# Patient Record
Sex: Female | Born: 1945
Health system: Southern US, Community
[De-identification: ages and names within clinical notes are randomized; demographics above are authoritative.]

## PROBLEM LIST (undated history)

## (undated) DIAGNOSIS — I219 Acute myocardial infarction, unspecified: Secondary | ICD-10-CM

## (undated) DIAGNOSIS — E039 Hypothyroidism, unspecified: Secondary | ICD-10-CM

## (undated) DIAGNOSIS — M5136 Other intervertebral disc degeneration, lumbar region: Secondary | ICD-10-CM

## (undated) DIAGNOSIS — E785 Hyperlipidemia, unspecified: Secondary | ICD-10-CM

## (undated) DIAGNOSIS — H35039 Hypertensive retinopathy, unspecified eye: Secondary | ICD-10-CM

## (undated) DIAGNOSIS — H40059 Ocular hypertension, unspecified eye: Secondary | ICD-10-CM

## (undated) DIAGNOSIS — H3530 Unspecified macular degeneration: Secondary | ICD-10-CM

## (undated) DIAGNOSIS — H269 Unspecified cataract: Secondary | ICD-10-CM

## (undated) DIAGNOSIS — H409 Unspecified glaucoma: Secondary | ICD-10-CM

## (undated) DIAGNOSIS — I10 Essential (primary) hypertension: Secondary | ICD-10-CM

## (undated) DIAGNOSIS — M5431 Sciatica, right side: Secondary | ICD-10-CM

## (undated) DIAGNOSIS — M51369 Other intervertebral disc degeneration, lumbar region without mention of lumbar back pain or lower extremity pain: Secondary | ICD-10-CM

## (undated) DIAGNOSIS — S0300XA Dislocation of jaw, unspecified side, initial encounter: Secondary | ICD-10-CM

## (undated) HISTORY — DX: Sciatica, right side: M54.31

## (undated) HISTORY — DX: Unspecified cataract: H26.9

## (undated) HISTORY — DX: Other intervertebral disc degeneration, lumbar region: M51.36

## (undated) HISTORY — DX: Unspecified glaucoma: H40.9

## (undated) HISTORY — PX: ABDOMINAL SACROCOLPOPEXY: SHX1114

## (undated) HISTORY — PX: BREAST BIOPSY: SHX20

## (undated) HISTORY — PX: TONSILLECTOMY: SUR1361

## (undated) HISTORY — DX: Acute myocardial infarction, unspecified: I21.9

## (undated) HISTORY — DX: Hypertensive retinopathy, unspecified eye: H35.039

## (undated) HISTORY — PX: APOGEE / PERIGEE REPAIR: SHX1182

## (undated) HISTORY — PX: DILATION AND CURETTAGE OF UTERUS: SHX78

## (undated) HISTORY — DX: Unspecified macular degeneration: H35.30

## (undated) HISTORY — DX: Other intervertebral disc degeneration, lumbar region without mention of lumbar back pain or lower extremity pain: M51.369

## (undated) HISTORY — PX: ABDOMINAL HYSTERECTOMY: SHX81

---

## 2000-10-13 ENCOUNTER — Other Ambulatory Visit: Admission: RE | Admit: 2000-10-13 | Discharge: 2000-10-13 | Payer: Self-pay | Admitting: Obstetrics and Gynecology

## 2000-10-14 ENCOUNTER — Ambulatory Visit (HOSPITAL_COMMUNITY): Admission: RE | Admit: 2000-10-14 | Discharge: 2000-10-14 | Payer: Self-pay | Admitting: Obstetrics and Gynecology

## 2001-02-19 ENCOUNTER — Ambulatory Visit (HOSPITAL_COMMUNITY): Admission: RE | Admit: 2001-02-19 | Discharge: 2001-02-19 | Payer: Self-pay | Admitting: Obstetrics and Gynecology

## 2001-11-25 ENCOUNTER — Encounter: Admission: RE | Admit: 2001-11-25 | Discharge: 2001-11-25 | Payer: Self-pay | Admitting: General Surgery

## 2001-11-25 ENCOUNTER — Ambulatory Visit (HOSPITAL_BASED_OUTPATIENT_CLINIC_OR_DEPARTMENT_OTHER): Admission: RE | Admit: 2001-11-25 | Discharge: 2001-11-25 | Payer: Self-pay | Admitting: General Surgery

## 2001-11-25 ENCOUNTER — Encounter: Payer: Self-pay | Admitting: General Surgery

## 2010-11-07 ENCOUNTER — Other Ambulatory Visit: Payer: Self-pay | Admitting: Radiology

## 2010-11-07 DIAGNOSIS — N6489 Other specified disorders of breast: Secondary | ICD-10-CM

## 2010-11-14 ENCOUNTER — Ambulatory Visit
Admission: RE | Admit: 2010-11-14 | Discharge: 2010-11-14 | Disposition: A | Discharge status: Home / Self Care | Payer: Medicare Other | Source: Ambulatory Visit | Attending: Radiology | Admitting: Radiology

## 2010-11-14 DIAGNOSIS — N6489 Other specified disorders of breast: Secondary | ICD-10-CM

## 2010-11-14 MED ORDER — GADOBENATE DIMEGLUMINE 529 MG/ML IV SOLN
15.0000 mL | Freq: Once | INTRAVENOUS | Status: AC | PRN
  Administered 2010-11-14: 15 mL via INTRAVENOUS

## 2011-01-16 ENCOUNTER — Emergency Department (INDEPENDENT_AMBULATORY_CARE_PROVIDER_SITE_OTHER): Payer: Medicare Other

## 2011-01-16 ENCOUNTER — Other Ambulatory Visit: Payer: Self-pay

## 2011-01-16 ENCOUNTER — Inpatient Hospital Stay (HOSPITAL_BASED_OUTPATIENT_CLINIC_OR_DEPARTMENT_OTHER)
Admission: EM | Admit: 2011-01-16 | Discharge: 2011-01-18 | DRG: 281 | Disposition: A | Discharge status: Home / Self Care | Payer: Medicare Other | Attending: Cardiovascular Disease | Admitting: Cardiovascular Disease

## 2011-01-16 ENCOUNTER — Encounter: Payer: Self-pay | Admitting: *Deleted

## 2011-01-16 DIAGNOSIS — K5730 Diverticulosis of large intestine without perforation or abscess without bleeding: Secondary | ICD-10-CM

## 2011-01-16 DIAGNOSIS — E039 Hypothyroidism, unspecified: Secondary | ICD-10-CM | POA: Diagnosis present

## 2011-01-16 DIAGNOSIS — M549 Dorsalgia, unspecified: Secondary | ICD-10-CM

## 2011-01-16 DIAGNOSIS — E785 Hyperlipidemia, unspecified: Secondary | ICD-10-CM | POA: Diagnosis present

## 2011-01-16 DIAGNOSIS — I214 Non-ST elevation (NSTEMI) myocardial infarction: Principal | ICD-10-CM | POA: Diagnosis present

## 2011-01-16 DIAGNOSIS — R079 Chest pain, unspecified: Secondary | ICD-10-CM | POA: Diagnosis present

## 2011-01-16 DIAGNOSIS — I5181 Takotsubo syndrome: Secondary | ICD-10-CM | POA: Diagnosis present

## 2011-01-16 DIAGNOSIS — I10 Essential (primary) hypertension: Secondary | ICD-10-CM | POA: Diagnosis present

## 2011-01-16 DIAGNOSIS — Z7982 Long term (current) use of aspirin: Secondary | ICD-10-CM

## 2011-01-16 DIAGNOSIS — H40059 Ocular hypertension, unspecified eye: Secondary | ICD-10-CM | POA: Insufficient documentation

## 2011-01-16 HISTORY — DX: Dislocation of jaw, unspecified side, initial encounter: S03.00XA

## 2011-01-16 HISTORY — DX: Essential (primary) hypertension: I10

## 2011-01-16 HISTORY — DX: Hyperlipidemia, unspecified: E78.5

## 2011-01-16 HISTORY — DX: Hypothyroidism, unspecified: E03.9

## 2011-01-16 HISTORY — DX: Ocular hypertension, unspecified eye: H40.059

## 2011-01-16 LAB — BASIC METABOLIC PANEL
CO2: 27 mEq/L (ref 19–32)
Chloride: 102 mEq/L (ref 96–112)
GFR calc non Af Amer: >90 mL/min (ref >90)
Glucose, Bld: 127 mg/dL — ABNORMAL HIGH (ref 70–99)
Potassium: 3.4 mEq/L — ABNORMAL LOW (ref 3.5–5.1)
Sodium: 138 mEq/L (ref 135–145)

## 2011-01-16 LAB — DIFFERENTIAL
Eosinophils Absolute: 0.2 10*3/uL (ref 0.0–0.7)
Lymphs Abs: 1.3 10*3/uL (ref 0.7–4.0)
Monocytes Relative: 7 % (ref 3–12)
Neutrophils Relative %: 76 % (ref 43–77)

## 2011-01-16 LAB — CBC
HCT: 39.5 % (ref 36.0–46.0)
Hemoglobin: 13 g/dL (ref 12.0–15.0)
MCH: 24.6 pg — ABNORMAL LOW (ref 26.0–34.0)
MCV: 74.7 fL — ABNORMAL LOW (ref 78.0–100.0)
RBC: 5.29 MIL/uL — ABNORMAL HIGH (ref 3.87–5.11)

## 2011-01-16 MED ORDER — CALCIUM CITRATE-VITAMIN D 315-200 MG-UNIT PO TABS: 1.0000 | ORAL_TABLET | Freq: Every day | ORAL | Status: DC

## 2011-01-16 MED ORDER — ASPIRIN EC 81 MG PO TBEC
81.0000 mg | DELAYED_RELEASE_TABLET | Freq: Once | ORAL | Status: DC
  Filled 2011-01-16: qty 1

## 2011-01-16 MED ORDER — LOSARTAN POTASSIUM 50 MG PO TABS
100.0000 mg | ORAL_TABLET | Freq: Every day | ORAL | Status: DC
  Administered 2011-01-17 – 2011-01-18 (×2): 100 mg via ORAL
  Filled 2011-01-16 (×2): qty 2

## 2011-01-16 MED ORDER — SODIUM CHLORIDE 0.9 % IJ SOLN: 3.0000 mL | INTRAMUSCULAR | Status: DC | PRN

## 2011-01-16 MED ORDER — ROSUVASTATIN CALCIUM 20 MG PO TABS
20.0000 mg | ORAL_TABLET | Freq: Every day | ORAL | Status: DC
  Filled 2011-01-16: qty 1

## 2011-01-16 MED ORDER — SODIUM CHLORIDE 0.9 % IV BOLUS (SEPSIS): 500.0000 mL | Freq: Once | INTRAVENOUS | Status: DC

## 2011-01-16 MED ORDER — MORPHINE SULFATE 4 MG/ML IJ SOLN
4.0000 mg | INTRAMUSCULAR | Status: AC
  Administered 2011-01-16: 4 mg via INTRAVENOUS
  Filled 2011-01-16: qty 1

## 2011-01-16 MED ORDER — DIAZEPAM 5 MG PO TABS
5.0000 mg | ORAL_TABLET | ORAL | Status: AC
  Administered 2011-01-18: 5 mg via ORAL

## 2011-01-16 MED ORDER — SODIUM CHLORIDE 0.9 % IJ SOLN
3.0000 mL | Freq: Two times a day (BID) | INTRAMUSCULAR | Status: DC
  Administered 2011-01-17: 3 mL via INTRAVENOUS

## 2011-01-16 MED ORDER — HEPARIN BOLUS VIA INFUSION
4000.0000 [IU] | Freq: Once | INTRAVENOUS | Status: AC
  Administered 2011-01-16: 4000 [IU] via INTRAVENOUS
  Filled 2011-01-16: qty 4000

## 2011-01-16 MED ORDER — ASPIRIN EC 81 MG PO TBEC
81.0000 mg | DELAYED_RELEASE_TABLET | Freq: Every day | ORAL | Status: DC
  Filled 2011-01-16 (×2): qty 1

## 2011-01-16 MED ORDER — ASPIRIN 81 MG PO CHEW
324.0000 mg | CHEWABLE_TABLET | ORAL | Status: AC
  Administered 2011-01-17: 324 mg via ORAL
  Filled 2011-01-16: qty 4

## 2011-01-16 MED ORDER — LEVOTHYROXINE SODIUM 75 MCG PO TABS
75.0000 ug | ORAL_TABLET | Freq: Every day | ORAL | Status: DC
  Administered 2011-01-17 – 2011-01-18 (×2): 75 ug via ORAL
  Filled 2011-01-16 (×2): qty 1

## 2011-01-16 MED ORDER — ONDANSETRON HCL 4 MG/2ML IJ SOLN
INTRAMUSCULAR | Status: AC
  Filled 2011-01-16: qty 2

## 2011-01-16 MED ORDER — BIMATOPROST 0.03 % OP SOLN
1.0000 [drp] | Freq: Every day | OPHTHALMIC | Status: DC
  Administered 2011-01-17 (×2): 1 [drp] via OPHTHALMIC
  Filled 2011-01-16: qty 2.5

## 2011-01-16 MED ORDER — CALCIUM CARBONATE-VITAMIN D 500-200 MG-UNIT PO TABS
1.0000 | ORAL_TABLET | Freq: Two times a day (BID) | ORAL | Status: DC
  Administered 2011-01-17 – 2011-01-18 (×3): 1 via ORAL
  Filled 2011-01-16 (×5): qty 1

## 2011-01-16 MED ORDER — POTASSIUM CHLORIDE CRYS ER 10 MEQ PO TBCR
10.0000 meq | EXTENDED_RELEASE_TABLET | Freq: Three times a day (TID) | ORAL | Status: DC
  Administered 2011-01-17 – 2011-01-18 (×5): 10 meq via ORAL
  Filled 2011-01-16 (×7): qty 1

## 2011-01-16 MED ORDER — ONDANSETRON HCL 4 MG/2ML IJ SOLN
4.0000 mg | Freq: Once | INTRAMUSCULAR | Status: AC
  Administered 2011-01-16: 4 mg via INTRAVENOUS

## 2011-01-16 MED ORDER — ACETAMINOPHEN 325 MG PO TABS: 650.0000 mg | ORAL_TABLET | ORAL | Status: DC | PRN

## 2011-01-16 MED ORDER — VITAMIN D3 25 MCG (1000 UNIT) PO TABS
1000.0000 [IU] | ORAL_TABLET | Freq: Every day | ORAL | Status: DC
  Administered 2011-01-17 – 2011-01-18 (×2): 1000 [IU] via ORAL
  Filled 2011-01-16 (×3): qty 1

## 2011-01-16 MED ORDER — LOSARTAN POTASSIUM 50 MG PO TABS: 100.0000 mg | ORAL_TABLET | Freq: Every day | ORAL | Status: DC

## 2011-01-16 MED ORDER — ONDANSETRON HCL 4 MG/2ML IJ SOLN: 4.0000 mg | Freq: Four times a day (QID) | INTRAMUSCULAR | Status: DC | PRN

## 2011-01-16 MED ORDER — LIOTHYRONINE SODIUM 25 MCG PO TABS
25.0000 ug | ORAL_TABLET | Freq: Every day | ORAL | Status: DC
  Administered 2011-01-17 – 2011-01-18 (×2): 25 ug via ORAL
  Filled 2011-01-16 (×2): qty 1

## 2011-01-16 MED ORDER — NITROGLYCERIN IN D5W 200-5 MCG/ML-% IV SOLN
3.0000 ug/min | INTRAVENOUS | Status: DC
  Administered 2011-01-17: 3 ug/min via INTRAVENOUS
  Filled 2011-01-16: qty 250

## 2011-01-16 MED ORDER — TIMOLOL HEMIHYDRATE 0.25 % OP SOLN: 1.0000 [drp] | Freq: Two times a day (BID) | OPHTHALMIC | Status: DC

## 2011-01-16 MED ORDER — IOHEXOL 350 MG/ML SOLN
100.0000 mL | Freq: Once | INTRAVENOUS | Status: AC | PRN
  Administered 2011-01-16: 100 mL via INTRAVENOUS

## 2011-01-16 MED ORDER — NITROGLYCERIN 0.4 MG SL SUBL
0.4000 mg | SUBLINGUAL_TABLET | SUBLINGUAL | Status: DC | PRN
  Administered 2011-01-16: 0.4 mg via SUBLINGUAL
  Filled 2011-01-16: qty 25

## 2011-01-16 MED ORDER — SODIUM CHLORIDE 0.9 % IV SOLN: 250.0000 mL | INTRAVENOUS | Status: DC

## 2011-01-16 MED ORDER — ASPIRIN 81 MG PO CHEW
324.0000 mg | CHEWABLE_TABLET | Freq: Once | ORAL | Status: AC
  Administered 2011-01-16: 324 mg via ORAL
  Filled 2011-01-16: qty 4

## 2011-01-16 MED ORDER — ZOLPIDEM TARTRATE 5 MG PO TABS: 5.0000 mg | ORAL_TABLET | Freq: Every day | ORAL | Status: DC

## 2011-01-16 MED ORDER — HEPARIN (PORCINE) IN NACL 100-0.45 UNIT/ML-% IJ SOLN
16.0000 [IU]/kg/h | Freq: Once | INTRAMUSCULAR | Status: AC
  Administered 2011-01-16: 16 [IU]/kg/h via INTRAVENOUS
  Filled 2011-01-16: qty 250

## 2011-01-16 MED ORDER — TRIAMTERENE-HCTZ 37.5-25 MG PO TABS
1.0000 | ORAL_TABLET | Freq: Every day | ORAL | Status: DC
  Administered 2011-01-17 – 2011-01-18 (×2): 1 via ORAL
  Filled 2011-01-16 (×2): qty 1

## 2011-01-16 MED ORDER — OSTEO BI-FLEX/5-LOXIN ADVANCED PO TABS: 1.0000 | ORAL_TABLET | Freq: Every day | ORAL | Status: DC

## 2011-01-16 NOTE — ED Provider Notes (Cosign Needed Addendum)
History     CSN: [id number] Arrival date & time: 01/16/2011  6:15 PM   First MD Initiated Contact with Patient 01/16/11 1823      Chief Complaint  Patient presents with  . URI  . Shortness of Breath   The patient has had a recent cold symptoms. This afternoon at approximately 4 PM she was walking in the cold weather. She was also talking with her mother on the phone and feels that she was in somewhat of a heated conversation. Patient states that she felt that she inhaled the cold air into her lungs and began having a discomfort in her upper chest and her back when this occurred. She states the back. Tenderness is still present. She had no nausea, vomiting. No radiation of the pain to the extremities. She did have recent surgery in September but denies any leg swelling or pain. Patient had no fever, no syncope. No dyspnea. No vomiting. She reports a normal stress test done approximately 10 years ago. Patient also states that she does have chronic anxiety. (Consider location/radiation/quality/duration/timing/severity/associated sxs/prior treatment) Patient is a 65 y.o. female presenting with URI and shortness of breath.  URI  Shortness of Breath  Associated symptoms include shortness of breath.    Past Medical History  Diagnosis Date  . Hypertension   . Hyperlipemia   . Thyroid disease   . Ocular hypertension   . TMJ (dislocation of temporomandibular joint)     Past Surgical History  Procedure Date  . Abdominal hysterectomy   . Tonsillectomy     History reviewed. No pertinent family history.  History  Substance Use Topics  . Smoking status: Never Smoker   . Smokeless tobacco: Not on file  . Alcohol Use: No    OB History    Grav Para Term Preterm Abortions TAB SAB Ect Mult Living                  Review of Systems  Respiratory: Positive for shortness of breath.   All other systems reviewed and are negative.    Allergies  Atenolol hydrochloride  Home  Medications   Current Outpatient Rx  Name Route Sig Dispense Refill  . BIMATOPROST 0.03 % OP SOLN Both Eyes Place 1 drop into both eyes at bedtime.      Marland Kitchen VITAMIN D 1000 UNITS PO TABS Oral Take 1,000 Units by mouth daily.        BP 139/76  Pulse 59  Temp(Src) 96.9 F (36.1 C) (Oral)  Resp 16  Ht 5\' 6"  (1.676 m)  Wt 142 lb (64.411 kg)  BMI 22.92 kg/m2  SpO2 100%  Physical Exam  Constitutional: She is oriented to person, place, and time. She appears well-developed and well-nourished. No distress.  HENT:  Head: Normocephalic and atraumatic.  Eyes: Conjunctivae and EOM are normal. Pupils are equal, round, and reactive to light.  Neck: Neck supple.  Cardiovascular: Normal rate and regular rhythm.  Exam reveals no gallop and no friction rub.   No murmur heard. Pulmonary/Chest: Breath sounds normal. No respiratory distress. She has no wheezes. She has no rales. She exhibits no tenderness.  Abdominal: Soft. Bowel sounds are normal. She exhibits no distension. There is no tenderness. There is no rebound and no guarding.  Musculoskeletal: Normal range of motion. She exhibits no edema and no tenderness.  Neurological: She is alert and oriented to person, place, and time. No cranial nerve deficit. Coordination normal.  Skin: Skin is warm and dry. No rash  noted. She is not diaphoretic.  Psychiatric: She has a normal mood and affect.    ED Course  Procedures (including critical care time)  Labs Reviewed - No data to display No results found.   No diagnosis found.    MDM  Patient is seen and examined, initial history and physical is completed. Evaluation initiated      Date: 01/16/2011  Rate: 59  Rhythm: Sinus Bradycardia  QRS Axis: normal  Intervals: normal  ST/T Wave abnormalities: nonspecific ST changes  Conduction Disutrbances:none  Narrative Interpretation:   Old EKG Reviewed: unchanged Low voltage.          Jearl Soto A. Patrica Duel, MD 01/16/11 1832  Lorelle Gibbs.  Patrica Duel, MD 01/16/11 1836  No results found for this or any previous visit. No results found.  Results for orders placed during the hospital encounter of 01/16/11  CARDIAC PANEL(CRET KIN+CKTOT+MB+TROPI)      Component Value Range   Total CK 138  7 - 177 (U/L)   CK, MB 12.5 (*) 0.3 - 4.0 (ng/mL)   Troponin I 2.86 (*) <0.30 (ng/mL)   Relative Index 9.1 (*) 0.0 - 2.5   BASIC METABOLIC PANEL      Component Value Range   Sodium 138  135 - 145 (mEq/L)   Potassium 3.4 (*) 3.5 - 5.1 (mEq/L)   Chloride 102  96 - 112 (mEq/L)   CO2 27  19 - 32 (mEq/L)   Glucose, Bld 127 (*) 70 - 99 (mg/dL)   BUN 12  6 - 23 (mg/dL)   Creatinine, Ser 1.61  0.50 - 1.10 (mg/dL)   Calcium 9.8  8.4 - 09.6 (mg/dL)   GFR calc non Af Amer >90  >90 (mL/min)   GFR calc Af Amer >90  >90 (mL/min)  D-DIMER, QUANTITATIVE      Component Value Range   D-Dimer, Quant 0.33  0.00 - 0.48 (ug/mL-FEU)   Dg Chest 2 View  01/16/2011  *RADIOLOGY REPORT*  Clinical Data: Chest pain.  CHEST - 2 VIEW  Comparison: None  Findings: The cardiac silhouette, mediastinal and hilar contours are within normal limits.  The lungs are clear.  No pleural effusion.  The bony thorax is intact.  IMPRESSION: No acute cardiopulmonary findings.  Original Report Authenticated By: P. Loralie Champagne, M.D.      Labs have been reviewed, elevated troponin is noted. Patient will be given nitroglycerin and cardiology is paged stat to arrange transfer. Repeat EKG is being done. Patient's pain is down to a 3/10 and still has some back pain. Patient will be transferred as soon as this can be arranged  Theron Arista A. Patrica Duel, MD 01/16/11 1933  7:40 PM Await call back from cardiology.     Kenzi Bardwell A. Patrica Duel, MD 01/16/11 1940   Date: 01/16/2011   Rate: 60  Rhythm: normal sinus rhythm  QRS Axis: normal  Intervals: normal  ST/T Wave abnormalities: nonspecific ST changes  Conduction Disutrbances:none  Narrative Interpretation:   Old EKG Reviewed: changes  noted Nonspecific ST changes in lead 2, less than 1 mm      Shyler Hamill A. Patrica Duel, MD 01/16/11 1945  Discuss with cardiology, Dr. Eldred Manges her he will accept the patient to the step down [facility] at [facility]. However, she would like to have a CT scan of her chest to rule out dissection. [employer] [facility] truck. His in route. Patient remains stable. Will continue to get her pain down to a 0 and will follow closely   CRITICAL CARE Performed by:  Alynna Hargrove A.   Total critical care time: 30  Critical care time was exclusive of separately billable procedures and treating other patients.  Critical care was necessary to treat or prevent imminent or life-threatening deterioration.  Critical care was time spent personally by me on the following activities: development of treatment plan with patient and/or surrogate as well as nursing, discussions with consultants, evaluation of patient's response to treatment, examination of patient, obtaining history from patient or surrogate, ordering and performing treatments and interventions, ordering and review of laboratory studies, ordering and review of radiographic studies, pulse oximetry and re-evaluation of patient's condition.   Jalaiyah Throgmorton A. Patrica Duel, MD 01/16/11 1949   7:53 PM At this point in time, the patient will be going to CAT scan her to rule out dissection. Her pain is down to a 1/10. Vital signs remained stable. Continue to follow very closely. [facility] will be sending a truck to transport the patient to [facility]. Holding heparin at this time until we can verify for certain that there is no dissection.  Lesly Pontarelli A. Patrica Duel, MD 01/16/11 1953    8:18 PM  CT discussed with Radiologist:  There is no evidence of any aortic dissection, vascularity is normal. This is a preliminary reading. CT scan was also briefly reviewed by myself.  Izik Bingman A. Patrica Duel, MD 01/16/11 2018  8:32 PM I discussed, again with the cardiologist. [facility] will be sending a truck  as a bed has become available. This point in time. Patient remains stable, her pain is down to 0 or 1. Blood pressure is normal. Her partner has been ordered and discuss with pharmacy  Deshaun Schou A. Patrica Duel, MD 01/16/11 2032  8:46 PM [facility] has arrived at this point in time, and will be taking the patient to [facility] for further care and evaluation. She does remain stable upon transfer  Staley Lunz A. Patrica Duel, MD 01/16/11 2046

## 2011-01-16 NOTE — ED Notes (Signed)
Pt c/o cold like symptoms x 2 weeks with neck pain.

## 2011-01-16 NOTE — H&P (Signed)
Admit date: 01/16/2011 Referring Physician Dr. Valma Cava Primary Cardiologist Dr. Delane Ginger, MD Chief complaint/reason for admission: Chest pain  HPI: This is a 65 year old white female with a history of hypertension and dyslipidemia who presented to [facility] with complaints of chest pain. She had had some recent cold symptoms and this afternoon approxi-4 PM was out walking in the cold weather. She said she been talking to her mother on the phone and feel that she was in somewhat of a heated conversation. She says she fell he she inhaled some colder interval lungs and had some problems with coughing [place]. She says her mom was coughing at the same time torrid off the phone. She then started having chest discomfort in her upper chest as well as into her back. She denied any nausea vomiting or diaphoresis. The pain did not radiate into her arms. Her pain continued to get worse. She described it as a severe ache in the midsternal region extending from her throat halfway down her chest. She presented [facility] where cardiac enzymes were elevated. She underwent chest CT to rule out aortic dissection. Chest CT showed no evidence of aortic dissection she now is admitted to [facility] for further evaluation. She says she has minimal chest pain    PMH:    Past Medical History  Diagnosis Date  . Hypertension   . Hyperlipemia   . Hypothyroidism   . Ocular hypertension   . TMJ (dislocation of temporomandibular joint)     PSH:    Past Surgical History  Procedure Date  . Abdominal hysterectomy   . Tonsillectomy   . [employer] / Teacher, early years/pre   . Abdominal sacrocolpopexy   . Dilation and curettage of uterus   . Breast biopsy     ALLERGIES:   Atenolol hydrochloride  Prior to Admit Meds:   Prescriptions prior to admission  Medication Sig Dispense Refill  . aspirin EC 81 MG tablet Take 81 mg by mouth once.        . bimatoprost (LUMIGAN) 0.03 % ophthalmic solution Place  1 drop into both eyes at bedtime.        . [place]-Glucosamine-Vit D (OSTEO BI-FLEX/5-LOXIN ADVANCED) TABS Take 1 tablet by mouth daily.       . calcium citrate-vitamin D (CITRACAL+D) 315-200 MG-UNIT per tablet Take 1-2 tablets by mouth daily.       . cholecalciferol (VITAMIN D) 1000 UNITS tablet Take 1,000 Units by mouth daily.        Marland Kitchen estradiol (VIVELLE-DOT) 0.1 MG/24HR Place 1 patch onto the skin 2 (two) times a week. Change on Wednesday and Saturday      . levothyroxine (SYNTHROID, LEVOTHROID) 75 MCG tablet Take 75 mcg by mouth daily.        Marland Kitchen liothyronine (CYTOMEL) 25 MCG tablet Take 25 mcg by mouth daily.        Marland Kitchen losartan (COZAAR) 100 MG tablet Take 100 mg by mouth daily.        . potassium chloride (K-DUR,KLOR-CON) 10 MEQ tablet Take 10 mEq by mouth 3 (three) times daily.        . rosuvastatin (CRESTOR) 20 MG tablet Take 20 mg by mouth daily.        . timolol (BETIMOL) 0.25 % ophthalmic solution Place 1 drop into both eyes 2 (two) times daily.        Marland Kitchen triamterene-hydrochlorothiazide (MAXZIDE-25) 37.5-25 MG per tablet Take 1 tablet by mouth daily.        Marland Kitchen  zolpidem (AMBIEN) 10 MG tablet Take 5 mg by mouth at bedtime.        Family HX:    Family History  Problem Relation Age of Onset  . Coronary artery disease Mother 29  . Prostate cancer Father    Social HX:    History   Social History  . Marital Status: Married    Spouse Name: N/A    Number of Children: N/A  . Years of Education: N/A   Occupational History  . Not on file.   Social History Main Topics  . Smoking status: Never Smoker   . Smokeless tobacco: Not on file  . Alcohol Use: 0.6 oz/week    1 Glasses of wine per week  . Drug Use: No  . Sexually Active: No   Other Topics Concern  . Not on file   Social History Narrative  . No narrative on file     ROS:  All 11 ROS were addressed and are negative except what is stated in the HPI  PHYSICAL EXAM Filed Vitals:   01/16/11 2040  BP: 123/59  Pulse:     Temp:   Resp: 16   General: Well developed, well nourished, in no acute distress Head: Eyes PERRLA, No xanthomas.   Normal cephalic and atramatic  Lungs:  Clear bilaterally to auscultation and percussion. Heart: HRRR S1 S2 Pulses are 2+ & equal.            No carotid bruit. No JVD.  No abdominal bruits. No femoral bruits. Abdomen: Bowel sounds are positive, abdomen soft and non-tender without masses or                  Hernia's noted. Extremities:   No clubbing, cyanosis or edema.  DP +1 Neuro: Alert and oriented X 3. Psych:  Good affect, responds appropriately   Labs:   Lab Results  Component Value Date   WBC 8.8 01/16/2011   HGB 13.0 01/16/2011   HCT 39.5 01/16/2011   MCV 74.7* 01/16/2011   PLT 254 01/16/2011    Lab 01/16/11 1856  NA 138  K 3.4*  CL 102  CO2 27  BUN 12  CREATININE 0.60  CALCIUM 9.8  PROT --  BILITOT --  ALKPHOS --  ALT --  AST --  GLUCOSE 127*   Lab Results  Component Value Date   CKTOTAL 138 01/16/2011   CKMB 12.5* 01/16/2011   TROPONINI 2.86* 01/16/2011       Radiology:*RADIOLOGY REPORT*  Clinical Data: Chest pain and back pain. Rule out dissection.  CT ANGIOGRAPHY CHEST, ABDOMEN AND PELVIS  Technique: Multidetector CT imaging through the chest, abdomen and  pelvis was performed using the standard protocol during bolus  administration of intravenous contrast. Multiplanar reconstructed  images including MIPs were obtained and reviewed to evaluate the  vascular anatomy.  Contrast: OMNIPAQUE IOHEXOL 350 MG/ML IV SOLN,  Comparison: None  CTA CHEST  Findings: No evidence of aortic dissection or intramural hematoma.  No evidence of aortic aneurysm or transection.  Unremarkable esophagus. Negative for abnormal mediastinal  adenopathy.  Right infraspinatus lipoma.  No filling defect in the pulmonary arterial tree to suggest acute  pulmonary thromboembolism.  No pneumothorax or pleural effusion  No mass or consolidation in the lungs.   Review of the MIP images confirms the above findings.  IMPRESSION:  No evidence of aortic dissection.  CTA ABDOMEN AND PELVIS  Findings: Aorta is nonaneurysmal and patent and without  dissection. Celiac, SMA,  and IMA are patent. Branch vessels are  patent. Single renal arteries are patent bilaterally.  Bilateral common, external, and iliac arteries are patent.  Sigmoid diverticulosis without evidence of diverticulitis. Liver,  gallbladder, spleen, pancreas, adrenal glands, kidneys are within  normal limits. Normal appendix.  No free fluid. No abnormal adenopathy. L4-5 and L5 S1  degenerative disc disease and facet arthropathy.  No destructive bone lesion.  Review of the MIP images confirms the above findings.  IMPRESSION:  No evidence of vascular dissection within the abdomen or pelvis.  Original Report Authenticated By: Donavan Burnet, M.D.     EKG: EKG showed normal sinus rhythm with no ST-T abnormalities    ASSESSMENT:  1. Non-ST elevation MI. She is essentially pain-free with only minimal discomfort in her chest. 2. Back pain related to non-ST elevation MI. Chest/abdomen/pelvic CT showed no evidence of aortic dissection. 3. Hypertension 4. Dyslipidemia 5. Hypothyroidism 6. Ocular hypertension   PLAN:   1. Admit to telemetry bed 2. Cycle cardiac enzymes until they peak 3. IV heparin drip per pharmacy protocol 4. IV heparin drip at 10 mcg per minute to be titrated to keep pain-free as long as systolic blood pressure greater than 100 mmHg 5. Continue her medications 6. Aspirin 325 mg daily 7. Will hold on beta blocker at this time since the patient states she has had some type of reaction to beta blockers in the past 8. Check fasting Statin panel in morning 9. Continue Crestor which she says she takes 20 mg one fourth tablet daily 10. N.p.o. after midnight 11. Cardiac catheterization in the a.m. per Dr. Renee Rival R  01/16/2011  10:23 PM

## 2011-01-16 NOTE — ED Notes (Signed)
Elevated troponin called from lab and given to Dr. Patrica Duel. MD at bedside now. Pt c/o nausea, md aware.

## 2011-01-16 NOTE — Progress Notes (Signed)
ANTICOAGULATION CONSULT NOTE - Initial Consult  Pharmacy Consult for Heparin Indication: NSTEMI  Allergies  Allergen Reactions  . Atenolol Hydrochloride Cough    Patient Measurements: Height: 5\' 6"  (167.6 cm) Weight: 142 lb (64.411 kg) IBW/kg (Calculated) : 59.3   Vital Signs: Temp: 96.9 F (36.1 C) (11/07 1811) Temp src: Oral (11/07 1811) BP: 123/59 mmHg (11/07 2040) Pulse Rate: 59  (11/07 1811)  Labs:  Alvira Philips 01/16/11 1856  HGB 13.0  HCT 39.5  PLT 254  APTT --  LABPROT --  INR --  HEPARINUNFRC --  CREATININE 0.60  CKTOTAL 138  CKMB 12.5*  TROPONINI 2.86*   Estimated Creatinine Clearance: 64.8 ml/min (by C-G formula based on Cr of 0.6).  Medical History: Past Medical History  Diagnosis Date  . Hypertension   . Hyperlipemia   . Hypothyroidism   . Ocular hypertension   . TMJ (dislocation of temporomandibular joint)     Medications:  Prescriptions prior to admission  Medication Sig Dispense Refill  . aspirin EC 81 MG tablet Take 81 mg by mouth once.        . bimatoprost (LUMIGAN) 0.03 % ophthalmic solution Place 1 drop into both eyes at bedtime.        . [place]-Glucosamine-Vit D (OSTEO BI-FLEX/5-LOXIN ADVANCED) TABS Take 1 tablet by mouth daily.       . calcium citrate-vitamin D (CITRACAL+D) 315-200 MG-UNIT per tablet Take 1-2 tablets by mouth daily.       . cholecalciferol (VITAMIN D) 1000 UNITS tablet Take 1,000 Units by mouth daily.        Marland Kitchen estradiol (VIVELLE-DOT) 0.1 MG/24HR Place 1 patch onto the skin 2 (two) times a week. Change on Wednesday and Saturday      . levothyroxine (SYNTHROID, LEVOTHROID) 75 MCG tablet Take 75 mcg by mouth daily.        Marland Kitchen liothyronine (CYTOMEL) 25 MCG tablet Take 25 mcg by mouth daily.       Marland Kitchen losartan (COZAAR) 100 MG tablet Take 100 mg by mouth daily.        . potassium chloride (K-DUR,KLOR-CON) 10 MEQ tablet Take 10 mEq by mouth 3 (three) times daily.        . rosuvastatin (CRESTOR) 20 MG tablet Take 20 mg by mouth  daily. 1/4 tablet daily      . timolol (BETIMOL) 0.25 % ophthalmic solution Place 1 drop into both eyes 2 (two) times daily.        Marland Kitchen triamterene-hydrochlorothiazide (MAXZIDE-25) 37.5-25 MG per tablet Take 1 tablet by mouth daily.        Marland Kitchen zolpidem (AMBIEN) 10 MG tablet Take 5 mg by mouth at bedtime.         Assessment: 65 y.o. Female presents with chest pain. Found to have NSTEMI. Started heparin with 4000 unit bolus and 1050 unit/hr gtt at ~2030.  Goal of Therapy:  Heparin level 0.3-0.7 units/ml   Plan:  1. Continue heparin gtt at 1050 units/hr 2. Will check 6 hr level 3. Daily heparin level and CBC  [place] Mielke, Hilario Quarry 01/16/2011,11:56 PM

## 2011-01-16 NOTE — ED Notes (Signed)
bp 84/55 after ntg, md aware and fluid bolus initiated

## 2011-01-17 ENCOUNTER — Ambulatory Visit (HOSPITAL_COMMUNITY): Admit: 2011-01-17 | Payer: Self-pay | Admitting: Cardiology

## 2011-01-17 ENCOUNTER — Other Ambulatory Visit: Payer: Self-pay

## 2011-01-17 ENCOUNTER — Encounter (HOSPITAL_COMMUNITY): Payer: Self-pay | Admitting: *Deleted

## 2011-01-17 DIAGNOSIS — I214 Non-ST elevation (NSTEMI) myocardial infarction: Secondary | ICD-10-CM

## 2011-01-17 LAB — CARDIAC PANEL(CRET KIN+CKTOT+MB+TROPI)
Relative Index: 9.1 — ABNORMAL HIGH (ref 0.0–2.5)
Total CK: 201 U/L — ABNORMAL HIGH (ref 7–177)
Troponin I: 2.98 ng/mL (ref <0.30)
Troponin I: 4.77 ng/mL (ref <0.30)
Troponin I: 5.91 ng/mL (ref <0.30)

## 2011-01-17 LAB — CBC
HCT: 38.2 % (ref 36.0–46.0)
Hemoglobin: 12.3 g/dL (ref 12.0–15.0)
MCH: 24.4 pg — ABNORMAL LOW (ref 26.0–34.0)
MCH: 24.5 pg — ABNORMAL LOW (ref 26.0–34.0)
MCHC: 32.1 g/dL (ref 30.0–36.0)
MCV: 76.1 fL — ABNORMAL LOW (ref 78.0–100.0)
Platelets: 229 10*3/uL (ref 150–400)
RBC: 4.64 MIL/uL (ref 3.87–5.11)
RBC: 5.02 MIL/uL (ref 3.87–5.11)
RDW: 16.2 % — ABNORMAL HIGH (ref 11.5–15.5)

## 2011-01-17 LAB — BASIC METABOLIC PANEL
BUN: 10 mg/dL (ref 6–23)
CO2: 24 mEq/L (ref 19–32)
CO2: 24 mEq/L (ref 19–32)
Calcium: 8.8 mg/dL (ref 8.4–10.5)
Calcium: 9.4 mg/dL (ref 8.4–10.5)
Chloride: 101 mEq/L (ref 96–112)
Creatinine, Ser: 0.6 mg/dL (ref 0.50–1.10)
Creatinine, Ser: 0.66 mg/dL (ref 0.50–1.10)
GFR calc Af Amer: >90 mL/min (ref >90)
GFR calc non Af Amer: >90 mL/min (ref >90)
Glucose, Bld: 181 mg/dL — ABNORMAL HIGH (ref 70–99)
Sodium: 141 mEq/L (ref 135–145)

## 2011-01-17 LAB — PLATELET INHIBITION P2Y12: Platelet Function  P2Y12: 336 [PRU] (ref 194–418)

## 2011-01-17 LAB — HEMOGLOBIN A1C
Hgb A1c MFr Bld: 6 % — ABNORMAL HIGH (ref <5.7)
Mean Plasma Glucose: 126 mg/dL — ABNORMAL HIGH (ref <117)

## 2011-01-17 LAB — SURGICAL PCR SCREEN
MRSA, PCR: NEGATIVE
Staphylococcus aureus: POSITIVE — AB

## 2011-01-17 LAB — PROTIME-INR
INR: 1 (ref 0.00–1.49)
Prothrombin Time: 13.4 seconds (ref 11.6–15.2)
Prothrombin Time: 13.5 seconds (ref 11.6–15.2)

## 2011-01-17 LAB — LIPID PANEL
LDL Cholesterol: 55 mg/dL (ref 0–99)
VLDL: 20 mg/dL (ref 0–40)

## 2011-01-17 MED ORDER — TIMOLOL MALEATE 0.25 % OP SOLN
1.0000 [drp] | Freq: Two times a day (BID) | OPHTHALMIC | Status: DC
  Administered 2011-01-17 – 2011-01-18 (×3): 1 [drp] via OPHTHALMIC
  Filled 2011-01-17: qty 5

## 2011-01-17 MED ORDER — TIMOLOL HEMIHYDRATE 0.25 % OP SOLN
1.0000 [drp] | Freq: Two times a day (BID) | OPHTHALMIC | Status: DC
  Filled 2011-01-17 (×9): qty 5

## 2011-01-17 MED ORDER — ALPRAZOLAM 0.5 MG PO TABS: 0.5000 mg | ORAL_TABLET | Freq: Three times a day (TID) | ORAL | Status: DC | PRN

## 2011-01-17 MED ORDER — SODIUM CHLORIDE 0.9 % IV SOLN
INTRAVENOUS | Status: DC
  Administered 2011-01-17: 75 mL via INTRAVENOUS
  Administered 2011-01-17: 16:00:00 via INTRAVENOUS

## 2011-01-17 MED ORDER — ZOLPIDEM TARTRATE 5 MG PO TABS
5.0000 mg | ORAL_TABLET | Freq: Every day | ORAL | Status: DC
  Administered 2011-01-17 (×2): 5 mg via ORAL
  Filled 2011-01-17 (×2): qty 1

## 2011-01-17 MED ORDER — HEPARIN (PORCINE) IN NACL 100-0.45 UNIT/ML-% IJ SOLN
1050.0000 [IU]/h | INTRAMUSCULAR | Status: DC
  Filled 2011-01-17 (×2): qty 250

## 2011-01-17 MED ORDER — HEART ATTACK BOUNCING BOOK
Freq: Once | Status: AC
  Administered 2011-01-17: 1
  Filled 2011-01-17: qty 1

## 2011-01-17 MED ORDER — ROSUVASTATIN CALCIUM 5 MG PO TABS
5.0000 mg | ORAL_TABLET | Freq: Every day | ORAL | Status: DC
  Administered 2011-01-17 – 2011-01-18 (×2): 5 mg via ORAL
  Filled 2011-01-17 (×3): qty 1

## 2011-01-17 MED ORDER — DOCUSATE SODIUM 100 MG PO CAPS
100.0000 mg | ORAL_CAPSULE | Freq: Every day | ORAL | Status: DC
  Administered 2011-01-18: 100 mg via ORAL
  Filled 2011-01-17: qty 1

## 2011-01-17 NOTE — Progress Notes (Signed)
ANTICOAGULATION CONSULT NOTE - Follow Up Consult  Pharmacy Consult for Heparin Indication: chest pain/ACS  Allergies  Allergen Reactions  . Atenolol Hydrochloride Cough    Patient Measurements: Height: 5\' 4"  (162.6 cm) Weight: 141 lb 15.6 oz (64.4 kg) IBW/kg (Calculated) : 54.7    Vital Signs: Temp: 97.8 F (36.6 C) (11/08 0854) Temp src: Oral (11/08 0854) BP: 112/67 mmHg (11/08 0800) Pulse Rate: 73  (11/08 0800)  Labs:  Basename 01/17/11 0635 01/16/11 2339 01/16/11 1856  HGB 11.3* 12.3 --  HCT 35.2* 38.2 39.5  PLT 229 242 254  APTT -- 108* --  LABPROT 13.4 13.5 --  INR 1.00 1.01 --  HEPARINUNFRC 0.66 -- --  CREATININE 0.66 0.60 0.60  CKTOTAL 219* 258* 138  CKMB 16.1* 23.6* 12.5*  TROPONINI 4.77* 5.91* 2.86*   Estimated Creatinine Clearance: 59.7 ml/min (by C-G formula based on Cr of 0.66).   Medications:  Heparin infusion at 1050 units/hr  Assessment: NSTEMI: Heparin level is therapeutic. Plans noted for cardiac cath.  Goal of Therapy:  Heparin level 0.3-0.7 units/ml   Plan:  Continue Heparin at 1050 units/hr. F/U after cath for anticoagulation needs.  Madolyn Frieze 01/17/2011,11:22 AM

## 2011-01-17 NOTE — Progress Notes (Signed)
Patient is stable.  Enzymes are down.  Still unable to get patient into the lab due to ongoing procedures.  No current chest pain.  Presently will plan for 7:30 am case.  Patient and husband in agreement given the current hour and surgical team availability.  TS   Shawnie Pons  MD, [facility]  6:49 PM

## 2011-01-17 NOTE — Progress Notes (Signed)
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  She walks several times a week and has never had similar cp.  She feels better this am    . aspirin  324 mg Oral Once  . aspirin  324 mg Oral Pre-Cath  . aspirin EC  81 mg Oral Once  . aspirin EC  81 mg Oral Daily  . bimatoprost  1 drop Both Eyes QHS  . calcium-vitamin D  1 tablet Oral BID  . cholecalciferol  1,000 Units Oral Daily  . diazepam  5 mg Oral On Call  . heparin  4,000 Units Intravenous Once  . heparin  16 Units/kg/hr Intravenous Once  . levothyroxine  75 mcg Oral Daily  . liothyronine  25 mcg Oral Daily  . losartan  100 mg Oral Daily  .  morphine injection  4 mg Intravenous STAT  . ondansetron (ZOFRAN) IV  4 mg Intravenous Once  . potassium chloride  10 mEq Oral TID  . rosuvastatin  20 mg Oral Daily  . timolol  1 drop Both Eyes BID  . triamterene-hydrochlorothiazide  1 each Oral Daily  . zolpidem  5 mg Oral QHS  . DISCONTD: calcium citrate-vitamin D  1-2 tablet Oral Daily  . DISCONTD: calcium citrate-vitamin D  1-2 tablet Oral Daily  . DISCONTD: losartan  100 mg Oral Daily  . DISCONTD: OSTEO BI-FLEX/5-LOXIN ADVANCED  1 tablet Oral Daily  . DISCONTD: sodium chloride  500 mL Intravenous Once  . DISCONTD: sodium chloride  3 mL Intravenous Q12H  . DISCONTD: sodium chloride  3 mL Intravenous Q12H  . DISCONTD: timolol  1 drop Both Eyes BID  . DISCONTD: timolol  1 drop Both Eyes BID  . DISCONTD: zolpidem  5 mg Oral QHS      . sodium chloride 75 mL/hr at 01/17/11 0600  . heparin 10.5 mL/hr (01/17/11 0600)  . nitroGLYCERIN 3 mcg/min (01/17/11 0300)  . DISCONTD: sodium chloride      Objective:  Vital Signs in the last 24 hours: Blood pressure 111/69, pulse 79, temperature 98.1 F (36.7 C), temperature source Oral, resp. rate 14, height 5\' 4"  (1.626 m), weight 141 lb 15.6 oz (64.4 kg), SpO2 98.00%. Temp:   [96.9 F (36.1 C)-98.1 F (36.7 C)] 98.1 F (36.7 C) (11/08 0552) Pulse Rate:  [59-103] 79  (11/08 0552) Resp:  [10-22] 14  (11/08 0552) BP: (79-139)/(52-91) 111/69 mmHg (11/08 0552) SpO2:  [92 %-100 %] 98 % (11/08 0552) FiO2 (%):  [2 %] 2 % (11/07 2300) Weight:  [141 lb 15.6 oz (64.4 kg)-142 lb (64.411 kg)] 141 lb 15.6 oz (64.4 kg) (11/07 2200)  Intake/Output from previous day: 11/07 0701 - 11/08 0700 In: -  Out: 700 [Urine:700] Intake/Output from this shift:    Physical Exam: The patient is alert and oriented x 3.  The mood and affect are normal.   Skin: warm and dry.  Color is normal.    HEENT:   the sclera are nonicteric.  The mucous membranes are moist.  The carotids are 2+ without bruits.  There is no thyromegaly.  There is no JVD.    Lungs: clear.  The chest wall is non tender.    Heart: regular rate with a normal S1 and S2.  There are no murmurs, gallops, or rubs. The PMI is not displaced.     Abdomen: good bowel  sounds.  There is no guarding or rebound.  There is no hepatosplenomegaly or tenderness.  There are no masses.   Extremities:  no clubbing, cyanosis, or edema.  The legs are without rashes.  The distal pulses are intact.   Neuro:  Cranial nerves II - XII are intact.  Motor and sensory functions are intact.     Lab Results:  [facility] 01/17/11 0635 01/16/11 2339  WBC 8.0 12.6*  HGB 11.3* 12.3  PLT 229 242    Basename 01/16/11 2339 01/16/11 1856  NA 136 138  K 3.6 3.4*  CL 101 102  CO2 24 27  GLUCOSE 181* 127*  BUN 10 12  CREATININE 0.60 0.60    Basename 01/16/11 2339 01/16/11 1856  TROPONINI 5.91* 2.86*   No results found for this basename: BNP in the last 72 hours Hepatic Function Panel No results found for this basename: PROT,ALBUMIN,AST,ALT,ALKPHOS,BILITOT,BILIDIR,IBILI in the last 72 hours No results found for this basename: CHOL in the last 72 hours No results found for this basename: PROTIME in the last 72 hours  ECG NSR. NS TWI in  AVL. No ST elevation  Assessment/Plan:    Non-STEMI (non-ST elevated myocardial infarction) (01/16/2011) The ECG from last night reveals slight TWI in the lateral leads.  This AM the ECG is better. Pain is better. Will schedule for cardiac cath .  Risks , benefits, options have been explined.  Pt and husband understand and agree to proceed.  Hypertension (01/16/2011)  BP is well controlled. Continue Losartan and HCTZ/ Triam  Dyslipidemia (01/16/2011)  Continue Crestor 20 QD    Vesta Mixer, Montez Hageman., MD, [facility] 01/17/2011, 7:39 AM

## 2011-01-18 ENCOUNTER — Encounter (HOSPITAL_COMMUNITY): Payer: Self-pay

## 2011-01-18 ENCOUNTER — Encounter (HOSPITAL_COMMUNITY)
Admission: EM | Disposition: A | Discharge status: Home / Self Care | Payer: Self-pay | Source: Home / Self Care | Attending: Cardiovascular Disease

## 2011-01-18 ENCOUNTER — Encounter (HOSPITAL_COMMUNITY): Payer: Self-pay | Admitting: Cardiology

## 2011-01-18 DIAGNOSIS — I5181 Takotsubo syndrome: Secondary | ICD-10-CM | POA: Diagnosis present

## 2011-01-18 DIAGNOSIS — I517 Cardiomegaly: Secondary | ICD-10-CM

## 2011-01-18 HISTORY — PX: LEFT HEART CATHETERIZATION WITH CORONARY ANGIOGRAM: SHX5451

## 2011-01-18 LAB — HEPARIN LEVEL (UNFRACTIONATED): Heparin Unfractionated: 0.47 IU/mL (ref 0.30–0.70)

## 2011-01-18 LAB — BASIC METABOLIC PANEL
Chloride: 108 mEq/L (ref 96–112)
GFR calc Af Amer: >90 mL/min (ref >90)
GFR calc non Af Amer: >90 mL/min (ref >90)
Potassium: 3.8 mEq/L (ref 3.5–5.1)

## 2011-01-18 SURGERY — LEFT HEART CATHETERIZATION WITH CORONARY ANGIOGRAM
Anesthesia: LOCAL

## 2011-01-18 MED ORDER — BISOPROLOL FUMARATE 5 MG PO TABS: 2.5000 mg | ORAL_TABLET | Freq: Every day | ORAL | Status: DC

## 2011-01-18 MED ORDER — FENTANYL CITRATE 0.05 MG/ML IJ SOLN
INTRAMUSCULAR | Status: AC
  Filled 2011-01-18: qty 2

## 2011-01-18 MED ORDER — SODIUM CHLORIDE 0.9 % IV SOLN
1.0000 mL/kg/h | INTRAVENOUS | Status: AC
  Administered 2011-01-18: 1 mL/kg/h via INTRAVENOUS

## 2011-01-18 MED ORDER — ACETAMINOPHEN 325 MG PO TABS: 650.0000 mg | ORAL_TABLET | ORAL | Status: DC | PRN

## 2011-01-18 MED ORDER — ASPIRIN 81 MG PO CHEW
81.0000 mg | CHEWABLE_TABLET | Freq: Every day | ORAL | Status: DC
  Administered 2011-01-18: 81 mg via ORAL
  Filled 2011-01-18: qty 1

## 2011-01-18 MED ORDER — DIAZEPAM 5 MG PO TABS
ORAL_TABLET | ORAL | Status: AC
  Filled 2011-01-18: qty 1

## 2011-01-18 MED ORDER — HEPARIN (PORCINE) IN NACL 2-0.9 UNIT/ML-% IJ SOLN
INTRAMUSCULAR | Status: AC
  Filled 2011-01-18: qty 2000

## 2011-01-18 MED ORDER — BISOPROLOL FUMARATE 5 MG PO TABS
2.5000 mg | ORAL_TABLET | Freq: Every day | ORAL | Status: DC
  Administered 2011-01-18: 2.5 mg via ORAL
  Filled 2011-01-18: qty 0.5

## 2011-01-18 MED ORDER — ASPIRIN 81 MG PO CHEW
324.0000 mg | CHEWABLE_TABLET | ORAL | Status: AC
  Administered 2011-01-18: 324 mg via ORAL
  Filled 2011-01-18: qty 4

## 2011-01-18 MED ORDER — NITROGLYCERIN 0.2 MG/ML ON CALL CATH LAB
INTRAVENOUS | Status: AC
  Filled 2011-01-18: qty 1

## 2011-01-18 MED ORDER — VERAPAMIL HCL 2.5 MG/ML IV SOLN
INTRAVENOUS | Status: AC
  Filled 2011-01-18: qty 2

## 2011-01-18 MED ORDER — HEPARIN SODIUM (PORCINE) 1000 UNIT/ML IJ SOLN
INTRAMUSCULAR | Status: AC
  Filled 2011-01-18: qty 1

## 2011-01-18 MED ORDER — MIDAZOLAM HCL 2 MG/2ML IJ SOLN
INTRAMUSCULAR | Status: AC
  Filled 2011-01-18: qty 2

## 2011-01-18 MED ORDER — ONDANSETRON HCL 4 MG/2ML IJ SOLN: 4.0000 mg | Freq: Four times a day (QID) | INTRAMUSCULAR | Status: DC | PRN

## 2011-01-18 MED ORDER — HEPARIN SODIUM (PORCINE) 5000 UNIT/ML IJ SOLN: 5000.0000 [IU] | Freq: Three times a day (TID) | INTRAMUSCULAR | Status: DC

## 2011-01-18 NOTE — Progress Notes (Signed)
 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  She walks several times a week and has never had similar cp.  She feels better this am.  She was not able to be cathed yesterday so she is rescheduled for today.   . aspirin  324 mg Oral Pre-Cath  . aspirin EC  81 mg Oral Once  . aspirin EC  81 mg Oral Daily  . bimatoprost  1 drop Both Eyes QHS  . calcium-vitamin D  1 tablet Oral BID  . cholecalciferol  1,000 Units Oral Daily  . diazepam      . diazepam  5 mg Oral On Call  . docusate sodium  100 mg Oral Daily  . heart attack bouncing book   Does not apply Once  . levothyroxine  75 mcg Oral Daily  . liothyronine  25 mcg Oral Daily  . losartan  100 mg Oral Daily  . potassium chloride  10 mEq Oral TID  . rosuvastatin  5 mg Oral Daily  . timolol  1 drop Both Eyes BID  . triamterene-hydrochlorothiazide  1 each Oral Daily  . zolpidem  5 mg Oral QHS  . DISCONTD: rosuvastatin  20 mg Oral Daily      . sodium chloride 75 mL/hr at 01/18/11 0700  . heparin 10.5 mL/hr (01/18/11 0700)  . nitroGLYCERIN 3 mcg/min (01/17/11 0300)    Objective:  Vital Signs in the last 24 hours: Blood pressure 119/67, pulse 74, temperature 98 F (36.7 C), temperature source Oral, resp. rate 14, height 5' 4" (1.626 m), weight 151 lb 3.8 oz (68.6 kg), SpO2 97.00%. Temp:  [97.3 F (36.3 C)-98 F (36.7 C)] 98 F (36.7 C) (11/09 0432) Pulse Rate:  [62-74] 74  (11/09 0432) Resp:  [13-16] 14  (11/09 0432) BP: (88-119)/(55-74) 119/67 mmHg (11/09 0432) SpO2:  [94 %-99 %] 97 % (11/09 0432) Weight:  [151 lb 3.8 oz (68.6 kg)] 151 lb 3.8 oz (68.6 kg) (11/09 0031)  Intake/Output from previous day: 11/08 0701 - 11/09 0700 In: 1331 [P.O.:360; I.V.:971] Out: 1550 [Urine:1550] Intake/Output from this shift:    Physical Exam: The patient is alert and oriented x 3.  The mood and affect are normal.     Skin: warm and dry.  Color is normal.    HEENT:   the sclera are nonicteric.  The mucous membranes are moist.  The carotids are 2+ without bruits.  There is no thyromegaly.  There is no JVD.    Lungs: clear.  The chest wall is non tender.    Heart: regular rate with a normal S1 and S2.  There are no murmurs, gallops, or rubs. The PMI is not displaced.     Abdomen: good bowel sounds.  There is no guarding or rebound.  There is no hepatosplenomegaly or tenderness.  There are no masses.   Extremities:  no clubbing, cyanosis, or edema.  The legs are without rashes.  The distal pulses are intact.   Neuro:  Cranial nerves II - XII are intact.  Motor and sensory functions are intact.     Lab Results:  Basename 01/17/11 0635 01/16/11 2339  WBC 8.0 12.6*  HGB 11.3* 12.3  PLT 229 242    Basename 01/18/11 0530 01/17/11 0635  NA 142 141  K 3.8 3.2*  CL 108 105  CO2 26 24  GLUCOSE 96   103*  BUN 8 9  CREATININE 0.64 0.66    Basename 01/17/11 1159 01/17/11 0635  TROPONINI 2.98* 4.77*   No results found for this basename: BNP in the last 72 hours Hepatic Function Panel No results found for this basename: PROT,ALBUMIN,AST,ALT,ALKPHOS,BILITOT,BILIDIR,IBILI in the last 72 hours  Basename 01/17/11 0635  CHOL 128   No results found for this basename: PROTIME in the last 72 hours  ECG NSR. NS TWI in AVL. No ST elevation  Assessment/Plan:    Non-STEMI (non-ST elevated myocardial infarction) (01/16/2011) The enzymes are trending down Pain is better. Will schedule for cardiac cath .  Risks , benefits, options have been explined.  Pt and husband understand and agree to proceed.  Hypertension (01/16/2011)  BP is well controlled. Continue Losartan and HCTZ/ Triam  Dyslipidemia (01/16/2011)  Continue Crestor 20 QD    Philip J. Nahser, Jr., MD, FACC 01/18/2011, 7:33 AM      

## 2011-01-18 NOTE — Interval H&P Note (Signed)
History and Physical Interval Note:   01/18/2011   7:42 AM   Michelle Bruce  has presented today for surgery, with the diagnosis of chest pain  The various methods of treatment have been discussed with the patient and family. After consideration of risks, benefits and other options for treatment, the patient has consented to  Procedure(s): LEFT HEART CATHETERIZATION WITH CORONARY ANGIOGRAM as a surgical intervention .  The patients' history has been reviewed, patient examined, no change in status, stable for surgery.  I have reviewed the patients' chart and labs.  Questions were answered to the patient's satisfaction.     Marca Ancona  MD

## 2011-01-18 NOTE — H&P (View-Only) (Signed)
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  She walks several times a week and has never had similar cp.  She feels better this am.  She was not able to be cathed yesterday so she is rescheduled for today.   Marland Kitchen aspirin  324 mg Oral Pre-Cath  . aspirin EC  81 mg Oral Once  . aspirin EC  81 mg Oral Daily  . bimatoprost  1 drop Both Eyes QHS  . calcium-vitamin D  1 tablet Oral BID  . cholecalciferol  1,000 Units Oral Daily  . diazepam      . diazepam  5 mg Oral On Call  . docusate sodium  100 mg Oral Daily  . heart attack bouncing book   Does not apply Once  . levothyroxine  75 mcg Oral Daily  . liothyronine  25 mcg Oral Daily  . losartan  100 mg Oral Daily  . potassium chloride  10 mEq Oral TID  . rosuvastatin  5 mg Oral Daily  . timolol  1 drop Both Eyes BID  . triamterene-hydrochlorothiazide  1 each Oral Daily  . zolpidem  5 mg Oral QHS  . DISCONTD: rosuvastatin  20 mg Oral Daily      . sodium chloride 75 mL/hr at 01/18/11 0700  . heparin 10.5 mL/hr (01/18/11 0700)  . nitroGLYCERIN 3 mcg/min (01/17/11 0300)    Objective:  Vital Signs in the last 24 hours: Blood pressure 119/67, pulse 74, temperature 98 F (36.7 C), temperature source Oral, resp. rate 14, height 5\' 4"  (1.626 m), weight 151 lb 3.8 oz (68.6 kg), SpO2 97.00%. Temp:  [97.3 F (36.3 C)-98 F (36.7 C)] 98 F (36.7 C) (11/09 0432) Pulse Rate:  [62-74] 74  (11/09 0432) Resp:  [13-16] 14  (11/09 0432) BP: (88-119)/(55-74) 119/67 mmHg (11/09 0432) SpO2:  [94 %-99 %] 97 % (11/09 0432) Weight:  [151 lb 3.8 oz (68.6 kg)] 151 lb 3.8 oz (68.6 kg) (11/09 0031)  Intake/Output from previous day: 11/08 0701 - 11/09 0700 In: 1331 [P.O.:360; I.V.:971] Out: 1550 [Urine:1550] Intake/Output from this shift:    Physical Exam: The patient is alert and oriented x 3.  The mood and affect are normal.     Skin: warm and dry.  Color is normal.    HEENT:   the sclera are nonicteric.  The mucous membranes are moist.  The carotids are 2+ without bruits.  There is no thyromegaly.  There is no JVD.    Lungs: clear.  The chest wall is non tender.    Heart: regular rate with a normal S1 and S2.  There are no murmurs, gallops, or rubs. The PMI is not displaced.     Abdomen: good bowel sounds.  There is no guarding or rebound.  There is no hepatosplenomegaly or tenderness.  There are no masses.   Extremities:  no clubbing, cyanosis, or edema.  The legs are without rashes.  The distal pulses are intact.   Neuro:  Cranial nerves II - XII are intact.  Motor and sensory functions are intact.     Lab Results:  [facility] 01/17/11 0635 01/16/11 2339  WBC 8.0 12.6*  HGB 11.3* 12.3  PLT 229 242    Basename 01/18/11 0530 01/17/11 0635  NA 142 141  K 3.8 3.2*  CL 108 105  CO2 26 24  GLUCOSE 96  103*  BUN 8 9  CREATININE 0.64 0.66    Basename 01/17/11 1159 01/17/11 0635  TROPONINI 2.98* 4.77*   No results found for this basename: BNP in the last 72 hours Hepatic Function Panel No results found for this basename: PROT,ALBUMIN,AST,ALT,ALKPHOS,BILITOT,BILIDIR,IBILI in the last 72 hours  Basename 01/17/11 0635  CHOL 128   No results found for this basename: PROTIME in the last 72 hours  ECG NSR. NS TWI in AVL. No ST elevation  Assessment/Plan:    Non-STEMI (non-ST elevated myocardial infarction) (01/16/2011) The enzymes are trending down Pain is better. Will schedule for cardiac cath .  Risks , benefits, options have been explined.  Pt and husband understand and agree to proceed.  Hypertension (01/16/2011)  BP is well controlled. Continue Losartan and HCTZ/ Triam  Dyslipidemia (01/16/2011)  Continue Crestor 20 QD    Vesta Mixer, Montez Hageman., MD, [facility] 01/18/2011, 7:33 AM

## 2011-01-18 NOTE — Progress Notes (Signed)
  Echocardiogram 2D Echocardiogram has been performed.  Dewitt Hoes, RDCS 01/18/2011, 11:12 AM

## 2011-01-18 NOTE — Procedures (Signed)
Cardiac Catheterization Procedure Note  Name: Michelle Bruce MRN: [id number] DOB: September 26, 1945  Procedure: Left Heart Cath, Selective Coronary Angiography, LV angiography  Indication: NSTEMI   Procedural Details: The right wrist was prepped, draped, and anesthetized with 1% lidocaine. Using the modified Seldinger technique, a 5 French sheath was introduced into the right radial artery. 3 mg of verapamil was administered through the sheath, weight-based unfractionated heparin was administered intravenously. Standard Judkins catheters were used for selective coronary angiography and left ventriculography. Catheter exchanges were performed over an exchange length guidewire. The LCA was engaged with the JL-3.5 catheter, the RCA was engaged with the JR4 catheter, and the LV was entered with the pigtail.  There were no immediate procedural complications. A TR band was used for radial hemostasis at the completion of the procedure.  The patient was transferred to the post catheterization recovery area for further monitoring.  Procedural Findings:  Hemodynamics:    AO 113/62    LV 110/11   Coronary angiography:   Coronary dominance: right    Left mainstem: Short vessel, no angiographic CAD.     Left anterior descending (LAD): Minimal luminal irregularities.  25% ostial D1 (moderate vessel).     Left circumflex (LCx): Large ramus with minimal disease.  No significant disease in the remainder of the CFX system.    Right coronary artery (RCA): Relatively small vessel but dominant.  Minimal luminal irregularities.   Left ventriculography: The mid-wall segments in the RAO projection appeared severely hypokinetic to akinetic.  The apical and basal segments appeared normal.  EF 40%.   Final Conclusions:  No angiographic CAD.  Suspect mid-wall Takotsubo variant. EF 40%.   Recommendations: Start beta blocker and ACEI.  Will get echo and discharge home later today.   Marca Ancona 01/18/2011, 8:23 AM

## 2011-01-18 NOTE — Discharge Summary (Signed)
Physician Discharge Summary  Patient ID: Michelle Bruce MRN: [id number] DOB/AGE: 1945-04-07 65 y.o.  Admit date: 01/16/2011 Discharge date: 01/18/2011  Primary Discharge Diagnosis: NSTEMI secondary to Takot Subo Variant Secondary Discharge Diagnosis:  HTN, dyslipidemia, hypothyroidism  Significant Diagnostic Studies: Cath - Final Conclusions: No angiographic CAD. Suspect mid-wall Takotsubo variant. EF 40%.  Echo:Left ventricle: The cavity size was normal. Wall thickness was increased in a pattern of mild LVH. The estimated ejection fraction was 60%. Wall motion was normal; there were no regional wall motion abnormalities.      Hospital Course:  Pt went to [facility] with CP, was admitted to [facility].   Cardiac ez indicated a NSTEMI. Pt treated medically and had no Sx. Pt is non-smoker. Lipids were checked and HDL 53, LDL 55. She was started on a beta blocker and continued on a statin and ARB. She was  taken to the cath lab on 11-9. No sig CAD at cath, +LV dysfunction (though not seen on echo). Post-procedure, if she ambulates without CP or SOB, she is considered stable for d/c, to f/u as OP.    Discharge Exam: Blood pressure 111/55, pulse 58, temperature 98 F (36.7 C), temperature source Oral, resp. rate 16, height 5\' 4"  (1.626 m), weight 151 lb 3.8 oz (68.6 kg), SpO2 97.00%. Labs:   Lab Results  Component Value Date   WBC 8.0 01/17/2011   HGB 11.3* 01/17/2011   HCT 35.2* 01/17/2011   MCV 75.9* 01/17/2011   PLT 229 01/17/2011    Lab 01/18/11 0530  NA 142  K 3.8  CL 108  CO2 26  BUN 8  CREATININE 0.64  CALCIUM 9.6  PROT --  BILITOT --  ALKPHOS --  ALT --  AST --  GLUCOSE 96   Lab Results       CK, MB   23.6   16.1   14.3       Total CK   258  219  201      Troponin I   5.91   4.77   2.98          Lab Results  Component Value Date   CHOL 128 01/17/2011   Lab Results  Component Value Date   HDL 53 01/17/2011   Lab Results  Component Value Date   LDLCALC  55 01/17/2011   Lab Results  Component Value Date   TRIG 101 01/17/2011   Lab Results  Component Value Date   CHOLHDL 2.4 01/17/2011       Radiology: IMPRESSION: No acute cardiopulmonary findings.  EKG: NSR, no acute ischemic changes   FOLLOW UP PLANS AND APPOINTMENTS Dr Jens Som in [facility] 12-5, 11:45  Current Discharge Medication List    START taking these medications   Details  bisoprolol (ZEBETA) 5 MG tablet Take 0.5 tablets (2.5 mg total) by mouth daily. Qty: 30 tablet, Refills: 11      CONTINUE these medications which have NOT CHANGED   Details  aspirin EC 81 MG tablet Take 81 mg by mouth once.      bimatoprost (LUMIGAN) 0.03 % ophthalmic solution Place 1 drop into both eyes at bedtime.      Boswellia-Glucosamine-Vit D (OSTEO BI-FLEX/5-LOXIN ADVANCED) TABS Take 1 tablet by mouth daily.     calcium citrate-vitamin D (CITRACAL+D) 315-200 MG-UNIT per tablet Take 1-2 tablets by mouth daily.     cholecalciferol (VITAMIN D) 1000 UNITS tablet Take 1,000 Units by mouth daily.      estradiol (VIVELLE-DOT)  0.1 MG/24HR Place 1 patch onto the skin 2 (two) times a week. Change on Wednesday and Saturday    levothyroxine (SYNTHROID, LEVOTHROID) 75 MCG tablet Take 75 mcg by mouth daily.      liothyronine (CYTOMEL) 25 MCG tablet Take 25 mcg by mouth daily.     losartan (COZAAR) 100 MG tablet Take 100 mg by mouth daily.      potassium chloride (K-DUR,KLOR-CON) 10 MEQ tablet Take 10 mEq by mouth 3 (three) times daily.      rosuvastatin (CRESTOR) 20 MG tablet Take 20 mg by mouth daily. 1/4 tablet daily    timolol (BETIMOL) 0.25 % ophthalmic solution Place 1 drop into both eyes 2 (two) times daily.      triamterene-hydrochlorothiazide (MAXZIDE-25) 37.5-25 MG per tablet Take 1 tablet by mouth daily.      zolpidem (AMBIEN) 10 MG tablet Take 5 mg by mouth at bedtime.          BRING ALL MEDICATIONS WITH YOU TO FOLLOW UP APPOINTMENTS  Time spent with patient to include physician  time: Signed: Theodore Demark 01/18/2011, 4:10 PM Elyn Aquas.  I have examined the patient and agree with the plan.  She has improved since her acute presentation several days ago.  Cath revealed no significant CAD and the LV gram is suggestive of diffuse coronary spasm. Takasubo syndrome. Home on medical therapy.  Will see in the office in several weeks.

## 2011-01-21 ENCOUNTER — Encounter (HOSPITAL_COMMUNITY): Payer: Self-pay

## 2011-02-08 ENCOUNTER — Encounter: Payer: Self-pay | Admitting: Cardiology

## 2011-02-08 ENCOUNTER — Encounter: Payer: Self-pay | Admitting: *Deleted

## 2011-02-13 ENCOUNTER — Encounter: Payer: Self-pay | Admitting: Cardiology

## 2011-02-13 ENCOUNTER — Ambulatory Visit (INDEPENDENT_AMBULATORY_CARE_PROVIDER_SITE_OTHER): Payer: Medicare Other | Admitting: Cardiology

## 2011-02-13 DIAGNOSIS — I10 Essential (primary) hypertension: Secondary | ICD-10-CM

## 2011-02-13 DIAGNOSIS — E785 Hyperlipidemia, unspecified: Secondary | ICD-10-CM

## 2011-02-13 DIAGNOSIS — D509 Iron deficiency anemia, unspecified: Secondary | ICD-10-CM | POA: Insufficient documentation

## 2011-02-13 DIAGNOSIS — I214 Non-ST elevation (NSTEMI) myocardial infarction: Secondary | ICD-10-CM

## 2011-02-13 DIAGNOSIS — R079 Chest pain, unspecified: Secondary | ICD-10-CM

## 2011-02-13 NOTE — Assessment & Plan Note (Signed)
Possibly secondary to takotsubo. LV function normalized on echocardiogram. Continue aspirin, beta blocker, ARB and statin. Continue risk factor modification.

## 2011-02-13 NOTE — Assessment & Plan Note (Signed)
Patient noted to have a microcytic anemia in the hospital. I have asked her to followup with her primary care for iron studies. She may require GI evaluation.

## 2011-02-13 NOTE — Progress Notes (Signed)
HPI: Michelle Bruce 65 year old female recently admitted to [facility] with a non-ST elevation myocardial infarction. Cardiac catheterization performed in November of 2012 showed a 25% ostial first diagonal. There was no other coronary disease noted. Ejection fraction was 40%. There was note that mid wall Takotsubo was suspected. Followup echocardiogram in November of 2012 showed normal LV function with mild left ventricular hypertrophy. CTA showed no dissection. Patient noted to have mild microcytic anemia. Since DC, she has not had dyspnea on exertion, orthopnea, PND or chest pain. She has noticed some fatigue.  Current Outpatient Prescriptions  Medication Sig Dispense Refill  . aspirin EC 81 MG tablet Take 81 mg by mouth once.        . bimatoprost (LUMIGAN) 0.03 % ophthalmic solution Place 1 drop into both eyes at bedtime.       . bisoprolol (ZEBETA) 5 MG tablet Take 0.5 tablets (2.5 mg total) by mouth daily.  30 tablet  11  . calcium citrate-vitamin D (CITRACAL+D) 315-200 MG-UNIT per tablet Take 1-2 tablets by mouth daily.       . cholecalciferol (VITAMIN D) 1000 UNITS tablet Take 1,000 Units by mouth daily.        . Estradiol (ESTRACE VA) as needed.        Marland Kitchen estradiol (VIVELLE-DOT) 0.1 MG/24HR Place 1 patch onto the skin 2 (two) times a week. Change on Wednesday and Saturday      . levothyroxine (SYNTHROID, LEVOTHROID) 75 MCG tablet Take 75 mcg by mouth daily.        Marland Kitchen liothyronine (CYTOMEL) 5 MCG tablet Take 5 mcg by mouth daily.        Marland Kitchen losartan (COZAAR) 100 MG tablet Take 100 mg by mouth daily.        . potassium chloride (K-DUR,KLOR-CON) 10 MEQ tablet Take 10 mEq by mouth 4 (four) times daily.       . rosuvastatin (CRESTOR) 20 MG tablet Take 20 mg by mouth daily. 1/4 tablet daily      . timolol (BETIMOL) 0.25 % ophthalmic solution Place 1 drop into both eyes 2 (two) times daily.        Marland Kitchen triamterene-hydrochlorothiazide (MAXZIDE-25) 37.5-25 MG per tablet Take 1 tablet by mouth daily.         Marland Kitchen zolpidem (AMBIEN) 10 MG tablet Take 5 mg by mouth at bedtime.          Past Medical History  Diagnosis Date  . Hypertension   . Hyperlipemia   . Hypothyroidism   . Ocular hypertension   . TMJ (dislocation of temporomandibular joint)   . Myocardial infarction     ? Takotsubo    Past Surgical History  Procedure Date  . Abdominal hysterectomy   . Tonsillectomy   . [employer] / Teacher, early years/pre   . Abdominal sacrocolpopexy   . Dilation and curettage of uterus   . Breast biopsy     History   Social History  . Marital Status: Married    Spouse Name: N/A    Number of Children: N/A  . Years of Education: N/A   Occupational History  . Not on file.   Social History Main Topics  . Smoking status: Never Smoker   . Smokeless tobacco: Not on file  . Alcohol Use: 0.6 oz/week    1 Glasses of wine per week  . Drug Use: No  . Sexually Active: No   Other Topics Concern  . Not on file   Social History Narrative  . No  narrative on file    ROS: fatigue in the afternoon but no fevers or chills, productive cough, hemoptysis, dysphasia, odynophagia, melena, hematochezia, dysuria, hematuria, rash, seizure activity, orthopnea, PND, pedal edema, claudication. Remaining systems are negative.  Physical Exam: Well-developed well-nourished in no acute distress.  Skin is warm and dry.  HEENT is normal.  Neck is supple. No thyromegaly.  Chest is clear to auscultation with normal expansion.  Cardiovascular exam is regular rate and rhythm.  Abdominal exam nontender or distended. No masses palpated. Extremities show no edema. neuro grossly intact  ECG sinus bradycardia at a rate of 83. Lateral T-wave inversion.

## 2011-02-13 NOTE — Patient Instructions (Signed)
Your physician wants you to follow-up in:  6 months. You will receive a reminder letter in the mail two months in advance. If you don't receive a letter, please call our office to schedule the follow-up appointment.   

## 2011-02-13 NOTE — Assessment & Plan Note (Signed)
Continue statin. Lipids and liver monitored by primary care. 

## 2011-02-13 NOTE — Assessment & Plan Note (Signed)
Blood pressure controlled. Continue present medications. 

## 2011-08-28 ENCOUNTER — Encounter: Payer: Medicare Other | Admitting: Cardiology

## 2011-08-28 NOTE — Progress Notes (Signed)
HPI: Pleasant female for fu of CAD; previously admitted to [facility] with a non-ST elevation myocardial infarction. Cardiac catheterization performed in November of 2012 showed a 25% ostial first diagonal. There was no other coronary disease noted. Ejection fraction was 40%. There was note that mid wall Takotsubo was suspected. Followup echocardiogram in November of 2012 showed normal LV function with mild left ventricular hypertrophy. CTA showed no dissection. Patient noted to have mild microcytic anemia. I last saw her in Dec 2012. Since then,    Current Outpatient Prescriptions  Medication Sig Dispense Refill  . aspirin EC 81 MG tablet Take 81 mg by mouth once.        . bimatoprost (LUMIGAN) 0.03 % ophthalmic solution Place 1 drop into both eyes at bedtime.       . bisoprolol (ZEBETA) 5 MG tablet Take 0.5 tablets (2.5 mg total) by mouth daily.  30 tablet  11  . calcium citrate-vitamin D (CITRACAL+D) 315-200 MG-UNIT per tablet Take 1-2 tablets by mouth daily.       . cholecalciferol (VITAMIN D) 1000 UNITS tablet Take 1,000 Units by mouth daily.        . Estradiol (ESTRACE VA) as needed.        Marland Kitchen estradiol (VIVELLE-DOT) 0.1 MG/24HR Place 1 patch onto the skin 2 (two) times a week. Change on Wednesday and Saturday      . levothyroxine (SYNTHROID, LEVOTHROID) 75 MCG tablet Take 75 mcg by mouth daily.        Marland Kitchen liothyronine (CYTOMEL) 5 MCG tablet Take 5 mcg by mouth daily.        Marland Kitchen losartan (COZAAR) 100 MG tablet Take 100 mg by mouth daily.        . potassium chloride (K-DUR,KLOR-CON) 10 MEQ tablet Take 10 mEq by mouth 4 (four) times daily.       . rosuvastatin (CRESTOR) 20 MG tablet Take 20 mg by mouth daily. 1/4 tablet daily      . timolol (BETIMOL) 0.25 % ophthalmic solution Place 1 drop into both eyes 2 (two) times daily.        Marland Kitchen triamterene-hydrochlorothiazide (MAXZIDE-25) 37.5-25 MG per tablet Take 1 tablet by mouth daily.        Marland Kitchen zolpidem (AMBIEN) 10 MG tablet Take 5 mg by  mouth at bedtime.          Past Medical History  Diagnosis Date  . Hypertension   . Hyperlipemia   . Hypothyroidism   . Ocular hypertension   . TMJ (dislocation of temporomandibular joint)   . Myocardial infarction     ? Takotsubo    Past Surgical History  Procedure Date  . Abdominal hysterectomy   . Tonsillectomy   . [employer] / [employer], early years/pre   . Abdominal sacrocolpopexy   . Dilation and curettage of uterus   . Breast biopsy     History   Social History  . Marital Status: Married    Spouse Name: N/A    Number of Children: N/A  . Years of Education: N/A   Occupational History  . Not on file.   Social History Main Topics  . Smoking status: Never Smoker   . Smokeless tobacco: Not on file  . Alcohol Use: 0.6 oz/week    1 Glasses of wine per week  . Drug Use: No  . Sexually Active: No   Other Topics Concern  . Not on file   Social History Narrative  . No narrative on file  ROS: no fevers or chills, productive cough, hemoptysis, dysphasia, odynophagia, melena, hematochezia, dysuria, hematuria, rash, seizure activity, orthopnea, PND, pedal edema, claudication. Remaining systems are negative.  Physical Exam: Well-developed well-nourished in no acute distress.  Skin is warm and dry.  HEENT is normal.  Neck is supple. No thyromegaly.  Chest is clear to auscultation with normal expansion.  Cardiovascular exam is regular rate and rhythm.  Abdominal exam nontender or distended. No masses palpated. Extremities show no edema. neuro grossly intact  ECG     This encounter was created in error - please disregard.

## 2011-09-04 ENCOUNTER — Ambulatory Visit (INDEPENDENT_AMBULATORY_CARE_PROVIDER_SITE_OTHER): Payer: Medicare Other | Admitting: Cardiology

## 2011-09-04 ENCOUNTER — Encounter: Payer: Self-pay | Admitting: Cardiology

## 2011-09-04 VITALS — BP 126/72 | HR 58 | Ht 66.0 in | Wt 150.0 lb

## 2011-09-04 DIAGNOSIS — I5181 Takotsubo syndrome: Secondary | ICD-10-CM

## 2011-09-04 DIAGNOSIS — I10 Essential (primary) hypertension: Secondary | ICD-10-CM

## 2011-09-04 DIAGNOSIS — E785 Hyperlipidemia, unspecified: Secondary | ICD-10-CM

## 2011-09-04 DIAGNOSIS — R079 Chest pain, unspecified: Secondary | ICD-10-CM

## 2011-09-04 NOTE — Assessment & Plan Note (Signed)
Blood pressure controlled. Continue present medications. Potassium and renal function monitored by primary care. 

## 2011-09-04 NOTE — Patient Instructions (Addendum)
Your physician wants you to follow-up in: ONE YEAR WITH DR CRENSHAW IN [place] You will receive a reminder letter in the mail two months in advance. If you don't receive a letter, please call our office to schedule the follow-up appointment.  

## 2011-09-04 NOTE — Progress Notes (Signed)
HPI: Pleasant female previously admitted to [facility] with a non-ST elevation myocardial infarction. Cardiac catheterization performed in November of 2012 showed a 25% ostial first diagonal. There was no other coronary disease noted. Ejection fraction was 40%. There was note that mid wall Takotsubo was suspected. Followup echocardiogram in November of 2012 showed normal LV function with mild left ventricular hypertrophy. CTA showed no dissection. Patient noted to have mild microcytic anemia. I last saw her in Dec 2012. Since then, an occasional chest pain for one to 2 seconds no exertional chest pain. No dyspnea or syncope. No pedal edema but she does have some tingling in her lower extremities.   Current Outpatient Prescriptions  Medication Sig Dispense Refill  . aspirin EC 81 MG tablet Take 81 mg by mouth once.        . bisoprolol (ZEBETA) 5 MG tablet Take 0.5 tablets (2.5 mg total) by mouth daily.  30 tablet  11  . calcium citrate-vitamin D (CITRACAL+D) 315-200 MG-UNIT per tablet Take 1-2 tablets by mouth daily.       . cholecalciferol (VITAMIN D) 1000 UNITS tablet Take 1,000 Units by mouth daily.        . Estradiol (ESTRACE VA) as needed.        Marland Kitchen estradiol (VIVELLE-DOT) 0.1 MG/24HR Place 1 patch onto the skin 2 (two) times a week. Change on Wednesday and Saturday      . FeFum-FePoly-FA-B Cmp-C-Biot (FOLIVANE-PLUS) CAPS Take by mouth daily. Patient takes capsule twice a week      . levothyroxine (SYNTHROID, LEVOTHROID) 75 MCG tablet Take 75 mcg by mouth daily.        Marland Kitchen liothyronine (CYTOMEL) 5 MCG tablet Take 5 mcg by mouth daily.        Marland Kitchen losartan (COZAAR) 100 MG tablet Take 100 mg by mouth daily.        . Multiple Vitamins-Minerals (CENTRUM SILVER PO) Take by mouth daily.      . potassium chloride (K-DUR,KLOR-CON) 10 MEQ tablet Take 10 mEq by mouth 4 (four) times daily.       . rosuvastatin (CRESTOR) 20 MG tablet Take 20 mg by mouth daily. 1/4 tablet daily      . Tafluprost  (ZIOPTAN) 0.0015 % SOLN Apply to eye daily.      Marland Kitchen triamterene-hydrochlorothiazide (MAXZIDE-25) 37.5-25 MG per tablet Take 1 tablet by mouth daily.        Marland Kitchen zolpidem (AMBIEN) 10 MG tablet Take 5 mg by mouth at bedtime.          Past Medical History  Diagnosis Date  . Hypertension   . Hyperlipemia   . Hypothyroidism   . Ocular hypertension   . TMJ (dislocation of temporomandibular joint)   . Myocardial infarction     ? Takotsubo    Past Surgical History  Procedure Date  . Abdominal hysterectomy   . Tonsillectomy   . [employer] / Teacher, early years/pre   . Abdominal sacrocolpopexy   . Dilation and curettage of uterus   . Breast biopsy     History   Social History  . Marital Status: Married    Spouse Name: N/A    Number of Children: N/A  . Years of Education: N/A   Occupational History  . Not on file.   Social History Main Topics  . Smoking status: Never Smoker   . Smokeless tobacco: Not on file  . Alcohol Use: 0.6 oz/week    1 Glasses of wine per week  .  Drug Use: No  . Sexually Active: No   Other Topics Concern  . Not on file   Social History Narrative  . No narrative on file    ROS: no fevers or chills, productive cough, hemoptysis, dysphasia, odynophagia, melena, hematochezia, dysuria, hematuria, rash, seizure activity, orthopnea, PND, pedal edema, claudication. Remaining systems are negative.  Physical Exam: Well-developed well-nourished in no acute distress.  Skin is warm and dry.  HEENT is normal.  Neck is supple.  Chest is clear to auscultation with normal expansion.  Cardiovascular exam is regular rate and rhythm.  Abdominal exam nontender or distended. No masses palpated. Extremities show no edema. neuro grossly intact  ECG sinus rhythm at a rate of 58. RV conduction delay. No ST changes.

## 2011-09-04 NOTE — Assessment & Plan Note (Signed)
LV function has normalized. Continue present medications. 

## 2011-09-04 NOTE — Assessment & Plan Note (Signed)
Continue statin. Lipids and liver monitored by primary care. 

## 2012-02-25 ENCOUNTER — Other Ambulatory Visit: Payer: Self-pay | Admitting: *Deleted

## 2012-02-25 ENCOUNTER — Other Ambulatory Visit: Payer: Self-pay | Admitting: Physician Assistant

## 2012-02-25 MED ORDER — BISOPROLOL FUMARATE 5 MG PO TABS: 2.5000 mg | ORAL_TABLET | Freq: Every day | ORAL | Status: DC

## 2012-05-27 ENCOUNTER — Telehealth: Payer: Self-pay | Admitting: *Deleted

## 2012-05-27 NOTE — Telephone Encounter (Signed)
Spoke with pt, we had received a letter from dr Noel Gerold with concerns regarding her being on a beta blocker and the interaction between medicines. After review from dr Jens Som the pt will cont on her beta blocker for her reduced LV function. Pt voiced understanding.

## 2012-06-03 ENCOUNTER — Ambulatory Visit (INDEPENDENT_AMBULATORY_CARE_PROVIDER_SITE_OTHER): Payer: Medicare Other | Admitting: Family Medicine

## 2012-06-03 ENCOUNTER — Encounter: Payer: Self-pay | Admitting: Family Medicine

## 2012-06-03 VITALS — BP 109/71 | HR 47 | Wt 156.0 lb

## 2012-06-03 DIAGNOSIS — E039 Hypothyroidism, unspecified: Secondary | ICD-10-CM

## 2012-06-03 MED ORDER — SYNTHROID 75 MCG PO TABS: 75.0000 ug | ORAL_TABLET | Freq: Every day | ORAL | Status: DC

## 2012-06-03 MED ORDER — LOSARTAN POTASSIUM 100 MG PO TABS: 100.0000 mg | ORAL_TABLET | Freq: Every day | ORAL | Status: DC

## 2012-06-03 MED ORDER — TRIAMTERENE-HCTZ 37.5-25 MG PO TABS: 1.0000 | ORAL_TABLET | Freq: Every day | ORAL | Status: DC

## 2012-06-03 NOTE — Progress Notes (Signed)
Subjective:     Patient ID: Michelle Bruce, female   DOB: 1945/06/28, 67 y.o.   MRN: [id number]  HPI Michelle Bruce is here today to go over her most recent lab result.  She also would like to discuss her hypothyroidism treatment.  She wonders if increasing her medication dosages may help her be less fatigued. The chest discomfort she has having at her last visit resolved with Nexium.     Review of Systems  Constitutional: Positive for fatigue.  Cardiovascular: Negative for chest pain.       Objective:   Physical Exam  Constitutional: She appears well-nourished.  Neck: No thyromegaly present.  Cardiovascular: Normal rate, regular rhythm and normal heart sounds.   Pulmonary/Chest: Effort normal and breath sounds normal.  Psychiatric: She has a normal mood and affect. Her behavior is normal. Judgment and thought content normal.       Assessment:     1)  Hypothyroidism 2) GERD  3)  Fatigue     Plan:    1)  We are going to switch her from levothyroxine to Synthroid to see if this improves her energy.   2)  She will remain on Nexium.

## 2012-06-04 ENCOUNTER — Ambulatory Visit (INDEPENDENT_AMBULATORY_CARE_PROVIDER_SITE_OTHER): Payer: Medicare Other | Admitting: Family Medicine

## 2012-06-04 DIAGNOSIS — E039 Hypothyroidism, unspecified: Secondary | ICD-10-CM

## 2012-06-04 NOTE — Patient Instructions (Addendum)
1)  Hypothyroidism:  Switch from levothyroxine to Synthroid. We'll recheck your thyroid level in 3 months.    Hypothyroidism The thyroid is a large gland located in the lower front of your neck. The thyroid gland helps control metabolism. Metabolism is how your body handles food. It controls metabolism with the hormone thyroxine. When this gland is underactive (hypothyroid), it produces too little hormone.  CAUSES These include:   Absence or destruction of thyroid tissue.  Goiter due to iodine deficiency.  Goiter due to medications.  Congenital defects (since birth).  Problems with the pituitary. This causes a lack of TSH (thyroid stimulating hormone). This hormone tells the thyroid to turn out more hormone. SYMPTOMS  Lethargy (feeling as though you have no energy)  Cold intolerance  Weight gain (in spite of normal food intake)  Dry skin  Coarse hair  Menstrual irregularity (if severe, may lead to infertility)  Slowing of thought processes Cardiac problems are also caused by insufficient amounts of thyroid hormone. Hypothyroidism in the newborn is cretinism, and is an extreme form. It is important that this form be treated adequately and immediately or it will lead rapidly to retarded physical and mental development. DIAGNOSIS  To prove hypothyroidism, your caregiver may do blood tests and ultrasound tests. Sometimes the signs are hidden. It may be necessary for your caregiver to watch this illness with blood tests either before or after diagnosis and treatment. TREATMENT  Low levels of thyroid hormone are increased by using synthetic thyroid hormone. This is a safe, effective treatment. It usually takes about four weeks to gain the full effects of the medication. After you have the full effect of the medication, it will generally take another four weeks for problems to leave. Your caregiver may start you on low doses. If you have had heart problems the dose may be gradually  increased. It is generally not an emergency to get rapidly to normal. HOME CARE INSTRUCTIONS   Take your medications as your caregiver suggests. Let your caregiver know of any medications you are taking or start taking. Your caregiver will help you with dosage schedules.  As your condition improves, your dosage needs may increase. It will be necessary to have continuing blood tests as suggested by your caregiver.  Report all suspected medication side effects to your caregiver. SEEK MEDICAL CARE IF: Seek medical care if you develop:  Sweating.  Tremulousness (tremors).  Anxiety.  Rapid weight loss.  Heat intolerance.  Emotional swings.  Diarrhea.  Weakness. SEEK IMMEDIATE MEDICAL CARE IF:  You develop chest pain, an irregular heart beat (palpitations), or a rapid heart beat. MAKE SURE YOU:   Understand these instructions.  Will watch your condition.  Will get help right away if you are not doing well or get worse. Document Released: 02/25/2005 Document Revised: 05/20/2011 Document Reviewed: 10/16/2007 [facility] Patient Information 2013 [place], [place].

## 2012-06-08 NOTE — Progress Notes (Signed)
Duplicate Encounter

## 2012-06-10 ENCOUNTER — Ambulatory Visit: Payer: Medicare Other | Admitting: Cardiology

## 2012-09-16 ENCOUNTER — Ambulatory Visit (INDEPENDENT_AMBULATORY_CARE_PROVIDER_SITE_OTHER): Payer: Medicare Other | Admitting: Cardiology

## 2012-09-16 ENCOUNTER — Encounter: Payer: Self-pay | Admitting: Cardiology

## 2012-09-16 VITALS — BP 138/80 | HR 45 | Wt 156.0 lb

## 2012-09-16 DIAGNOSIS — I5181 Takotsubo syndrome: Secondary | ICD-10-CM

## 2012-09-16 DIAGNOSIS — I10 Essential (primary) hypertension: Secondary | ICD-10-CM

## 2012-09-16 DIAGNOSIS — I498 Other specified cardiac arrhythmias: Secondary | ICD-10-CM

## 2012-09-16 DIAGNOSIS — R001 Bradycardia, unspecified: Secondary | ICD-10-CM

## 2012-09-16 DIAGNOSIS — E785 Hyperlipidemia, unspecified: Secondary | ICD-10-CM

## 2012-09-16 NOTE — Assessment & Plan Note (Signed)
Patient's heart rate is somewhat low today. Discontinue bisoprolol and follow.

## 2012-09-16 NOTE — Assessment & Plan Note (Signed)
She is describing some pain in her legs and wonders whether it may be her Crestor. Discontinue Crestor for 4-6 weeks to see if symptoms improve. If not we'll resume. Leg pain does not sound vascular.

## 2012-09-16 NOTE — Assessment & Plan Note (Signed)
Continue present medications other than discontinuing bisoprolol. Follow blood pressure and add additional medications as needed.

## 2012-09-16 NOTE — Patient Instructions (Addendum)
Your physician wants you to follow-up in: ONE YEAR WITH DR Shelda Pal will receive a reminder letter in the mail two months in advance. If you don't receive a letter, please call our office to schedule the follow-up appointment.  STOP BISOPROLOL  HOLD CRESTOR FOR 6-8 WEEKS THEN CALL AND LET us KNOW IF FEELING BETTER

## 2012-09-16 NOTE — Progress Notes (Signed)
HPI: Pleasant female for fu of Takotsubo CM; previously admitted to [facility] with a non-ST elevation myocardial infarction. Cardiac catheterization performed in November of 2012 showed a 25% ostial first diagonal. There was no other coronary disease noted. Ejection fraction was 40%. There was note that mid wall Takotsubo was suspected. Followup echocardiogram in November of 2012 showed normal LV function with mild left ventricular hypertrophy. CTA showed no dissection. I last saw her in June 2013. Since then, the patient denies any dyspnea on exertion, orthopnea, PND, pedal edema, palpitations, syncope or chest pain. She does note occasional tingling and stabbing pains in her legs. No claudication.     Current Outpatient Prescriptions  Medication Sig Dispense Refill  . aspirin EC 81 MG tablet Take 81 mg by mouth once.        . bisoprolol (ZEBETA) 5 MG tablet Take 0.5 tablets (2.5 mg total) by mouth daily.  30 tablet  6  . calcium citrate-vitamin D (CITRACAL+D) 315-200 MG-UNIT per tablet Take 1-2 tablets by mouth daily.       . cholecalciferol (VITAMIN D) 1000 UNITS tablet Take 1,000 Units by mouth daily.        Marland Kitchen esomeprazole (NEXIUM) 40 MG capsule Take 40 mg by mouth daily before breakfast.      . estradiol (VIVELLE-DOT) 0.1 MG/24HR Place 1 patch onto the skin 2 (two) times a week. Change on Wednesday and Saturday      . FeFum-FePoly-FA-B Cmp-C-Biot (FOLIVANE-PLUS) CAPS Take by mouth daily. Patient takes capsule twice a week      . levothyroxine (SYNTHROID, LEVOTHROID) 75 MCG tablet Take 75 mcg by mouth daily.        Marland Kitchen liothyronine (CYTOMEL) 5 MCG tablet Take 5 mcg by mouth daily.        Marland Kitchen losartan (COZAAR) 100 MG tablet Take 1 tablet (100 mg total) by mouth daily.  90 tablet  3  . Multiple Vitamins-Minerals (CENTRUM SILVER PO) Take by mouth daily.      . potassium chloride (K-DUR,KLOR-CON) 10 MEQ tablet Take 10 mEq by mouth 4 (four) times daily.       . rosuvastatin (CRESTOR) 20 MG  tablet 1/4 tablet daily      . Tafluprost (ZIOPTAN) 0.0015 % SOLN Apply to eye daily.      Marland Kitchen triamterene-hydrochlorothiazide (MAXZIDE-25) 37.5-25 MG per tablet Take 1 each (1 tablet total) by mouth daily.  90 tablet  3  . zolpidem (AMBIEN) 10 MG tablet Take 5 mg by mouth at bedtime.        No current facility-administered medications for this visit.     Past Medical History  Diagnosis Date  . Hypertension   . Hyperlipemia   . Hypothyroidism   . Ocular hypertension   . TMJ (dislocation of temporomandibular joint)   . Myocardial infarction     ? Takotsubo    Past Surgical History  Procedure Laterality Date  . Abdominal hysterectomy    . Tonsillectomy    . [employer] / Teacher, early years/pre    . Abdominal sacrocolpopexy    . Dilation and curettage of uterus    . Breast biopsy      History   Social History  . Marital Status: Married    Spouse Name: N/A    Number of Children: N/A  . Years of Education: N/A   Occupational History  . Not on file.   Social History Main Topics  . Smoking status: Never Smoker   . Smokeless tobacco: Not  on file  . Alcohol Use: 0.6 oz/week    1 Glasses of wine per week  . Drug Use: No  . Sexually Active: No   Other Topics Concern  . Not on file   Social History Narrative  . No narrative on file    ROS: some back and leg pain butno fevers or chills, productive cough, hemoptysis, dysphasia, odynophagia, melena, hematochezia, dysuria, hematuria, rash, seizure activity, orthopnea, PND, pedal edema, claudication. Remaining systems are negative.  Physical Exam: Well-developed well-nourished in no acute distress.  Skin is warm and dry.  HEENT is normal.  Neck is supple.  Chest is clear to auscultation with normal expansion.  Cardiovascular exam is regular rate and rhythm.  Abdominal exam nontender or distended. No masses palpated. Extremities show no edema. neuro grossly intact  ECG marked sinus bradycardia at a rate of 45. No ST  changes.

## 2012-09-16 NOTE — Assessment & Plan Note (Signed)
LV function has improved.

## 2012-10-19 ENCOUNTER — Encounter: Payer: Self-pay | Admitting: Family Medicine

## 2012-10-19 ENCOUNTER — Ambulatory Visit (INDEPENDENT_AMBULATORY_CARE_PROVIDER_SITE_OTHER): Payer: Medicare Other | Admitting: Family Medicine

## 2012-10-19 VITALS — BP 127/84 | HR 89 | Resp 16 | Ht 66.0 in | Wt 153.0 lb

## 2012-10-19 DIAGNOSIS — M549 Dorsalgia, unspecified: Secondary | ICD-10-CM

## 2012-10-19 NOTE — Progress Notes (Signed)
  Subjective:    Patient ID: Michelle Bruce, female    DOB: 06/21/1945, 67 y.o.   MRN: [id number]  Michelle Bruce is here today complaining of back/hip/knee pain.  She tried to make an appointment with a neurologist but they told her that she had to be referred by her PCP.     Back Pain This is a recurrent (She has had pain off and on for 30 years with 3 severe episodes over the past  6 years.  ) problem. The current episode started more than 1 month ago (This episode started in May.  ). The problem occurs intermittently. The problem has been waxing and waning since onset. The pain is present in the lumbar spine and gluteal. The quality of the pain is described as shooting and aching. The pain radiates to the right thigh, right knee and right foot. The pain is at a severity of 2/10 (Her pain has been as high as 9/10.  ). The pain is mild. The pain is worse during the day. The symptoms are aggravated by position, twisting, sitting, bending and standing. Stiffness is present: Her pain worsens after a day of activity.  Associated symptoms include numbness, paresthesias and tingling. Pertinent negatives include no bladder incontinence or bowel incontinence. She has tried chiropractic manipulation, ice and NSAIDs for the symptoms. The treatment provided mild relief.    Review of Systems  Constitutional: Negative.   HENT: Negative.   Eyes: Negative.   Respiratory: Negative.   Cardiovascular: Negative.   Gastrointestinal: Negative.  Negative for bowel incontinence.  Endocrine: Negative.   Genitourinary: Negative.  Negative for bladder incontinence.  Musculoskeletal: Positive for back pain.  Skin: Negative.   Allergic/Immunologic: Negative.   Neurological: Positive for tingling, numbness and paresthesias.       Right foot  Hematological: Negative.   Psychiatric/Behavioral: Negative.     Past Medical History  Diagnosis Date  . Hypertension   . Hyperlipemia   . Hypothyroidism   . Ocular hypertension   . TMJ  (dislocation of temporomandibular joint)   . Myocardial infarction     ? Takotsubo    Family History  Problem Relation Age of Onset  . Coronary artery disease Mother 66  . Prostate cancer Father     History   Social History Narrative   Marital Status: Married Scientist, research (physical sciences))   Children:  G2 P2002   Pets:  None    Living Situation: Lives with husband    Occupation: Retired   Education: 16   Tobacco Use/Exposure:  None    Alcohol Use:  Occasional   Drug Use:  None   Diet:  Regular   Exercise:  Walking    Hobbies: Traveling, Reading      Objective:   Physical Exam  Vitals reviewed. Constitutional: She appears well-developed and well-nourished.  Musculoskeletal: Normal range of motion.       Lumbar back: She exhibits pain.      Assessment & Plan:

## 2012-10-21 ENCOUNTER — Telehealth: Payer: Self-pay | Admitting: *Deleted

## 2012-10-21 NOTE — Telephone Encounter (Signed)
Left a voice mail to patient confirming her MRI of the lumbar spine w/o contrast was approved.  Approval # is [id number]. The approval period is 10/21/12 to 11/19/12. PG

## 2012-10-21 NOTE — Telephone Encounter (Signed)
MRI of her Lumbar spine w/o contrast was approved.  Approval # is [id number].  Period 10/21/12 - 11/19/12. PG

## 2012-10-24 ENCOUNTER — Ambulatory Visit (HOSPITAL_BASED_OUTPATIENT_CLINIC_OR_DEPARTMENT_OTHER)
Admission: RE | Admit: 2012-10-24 | Discharge: 2012-10-24 | Disposition: A | Discharge status: Home / Self Care | Payer: Medicare Other | Source: Ambulatory Visit | Attending: Family Medicine | Admitting: Family Medicine

## 2012-10-24 DIAGNOSIS — Q762 Congenital spondylolisthesis: Secondary | ICD-10-CM | POA: Insufficient documentation

## 2012-10-24 DIAGNOSIS — M549 Dorsalgia, unspecified: Secondary | ICD-10-CM

## 2012-10-24 DIAGNOSIS — M129 Arthropathy, unspecified: Secondary | ICD-10-CM | POA: Insufficient documentation

## 2012-10-24 DIAGNOSIS — M51379 Other intervertebral disc degeneration, lumbosacral region without mention of lumbar back pain or lower extremity pain: Secondary | ICD-10-CM | POA: Insufficient documentation

## 2012-10-24 DIAGNOSIS — M545 Low back pain, unspecified: Secondary | ICD-10-CM | POA: Insufficient documentation

## 2012-10-24 DIAGNOSIS — M5137 Other intervertebral disc degeneration, lumbosacral region: Secondary | ICD-10-CM | POA: Insufficient documentation

## 2012-10-27 ENCOUNTER — Telehealth: Payer: Self-pay | Admitting: *Deleted

## 2012-10-28 ENCOUNTER — Telehealth: Payer: Self-pay | Admitting: *Deleted

## 2012-10-28 NOTE — Telephone Encounter (Signed)
She is aware of her MRI results.  This report was mailed to her.  She is also aware that [facility] will contact her for an appointment with Dr Loletta Parish. PG

## 2012-10-29 ENCOUNTER — Encounter: Payer: Self-pay | Admitting: Family Medicine

## 2012-10-29 DIAGNOSIS — M545 Low back pain: Secondary | ICD-10-CM | POA: Insufficient documentation

## 2012-10-29 NOTE — Telephone Encounter (Signed)
Open in error

## 2012-10-29 NOTE — Assessment & Plan Note (Signed)
Sending patient for an MRI of her lumbar spine.  She also will be referred to Dr. Loletta Parish for neurological evaluation and treatment.

## 2012-11-16 ENCOUNTER — Telehealth: Payer: Self-pay | Admitting: Cardiology

## 2012-11-16 NOTE — Telephone Encounter (Signed)
Rtn nurse call.  °

## 2012-11-16 NOTE — Telephone Encounter (Signed)
LMTCB

## 2012-11-16 NOTE — Telephone Encounter (Signed)
Pt advised. She will restart crestor 5mg  daily and monitor her symptoms.

## 2012-11-16 NOTE — Telephone Encounter (Signed)
Resume crestor 5 mg po daily; lipids, liver and CK in 6 weeks Olga Millers

## 2012-11-16 NOTE — Telephone Encounter (Signed)
She will have lipid profile/liver profile/CK through her PCP Dr Alberteen Sam. She will call back if she would like to schedule  lab appt through Dr Jens Som.

## 2012-11-16 NOTE — Telephone Encounter (Signed)
New Problem  Pt states she was advised to hold Crestor for 6-8 wk's and call back w/ an update// Pt states she feels as if she is better but recently found out that she has degenerative problems in the lumbar 4 and 5/// she is not sure if this correlates to the numbness and cramps in her feet.

## 2012-11-16 NOTE — Telephone Encounter (Signed)
Pt states she has been off Crestor since 09/17/12. Pt states the numbness in her feet may be some better. She also has been going to a chiropractor for disc problem in her  back since May. She  feels she may have some improvement because of that.  She is unsure whether the improvement in the numbness in her feet is from being off crestor or from an improvement in her back problem. I will forward to Dr Jens Som for review

## 2012-11-26 ENCOUNTER — Encounter: Payer: Self-pay | Admitting: *Deleted

## 2012-11-30 ENCOUNTER — Encounter (HOSPITAL_BASED_OUTPATIENT_CLINIC_OR_DEPARTMENT_OTHER): Payer: Self-pay | Admitting: *Deleted

## 2012-11-30 ENCOUNTER — Emergency Department (HOSPITAL_BASED_OUTPATIENT_CLINIC_OR_DEPARTMENT_OTHER): Payer: Medicare Other

## 2012-11-30 ENCOUNTER — Other Ambulatory Visit: Payer: Self-pay | Admitting: *Deleted

## 2012-11-30 ENCOUNTER — Emergency Department (HOSPITAL_BASED_OUTPATIENT_CLINIC_OR_DEPARTMENT_OTHER)
Admission: EM | Admit: 2012-11-30 | Discharge: 2012-11-30 | Disposition: A | Discharge status: Home / Self Care | Payer: Medicare Other | Attending: Emergency Medicine | Admitting: Emergency Medicine

## 2012-11-30 DIAGNOSIS — R42 Dizziness and giddiness: Secondary | ICD-10-CM | POA: Insufficient documentation

## 2012-11-30 DIAGNOSIS — Z8739 Personal history of other diseases of the musculoskeletal system and connective tissue: Secondary | ICD-10-CM | POA: Insufficient documentation

## 2012-11-30 DIAGNOSIS — I252 Old myocardial infarction: Secondary | ICD-10-CM | POA: Insufficient documentation

## 2012-11-30 DIAGNOSIS — S301XXA Contusion of abdominal wall, initial encounter: Secondary | ICD-10-CM | POA: Insufficient documentation

## 2012-11-30 DIAGNOSIS — E039 Hypothyroidism, unspecified: Secondary | ICD-10-CM | POA: Insufficient documentation

## 2012-11-30 DIAGNOSIS — I959 Hypotension, unspecified: Secondary | ICD-10-CM | POA: Insufficient documentation

## 2012-11-30 DIAGNOSIS — M549 Dorsalgia, unspecified: Secondary | ICD-10-CM

## 2012-11-30 DIAGNOSIS — Z8719 Personal history of other diseases of the digestive system: Secondary | ICD-10-CM | POA: Insufficient documentation

## 2012-11-30 DIAGNOSIS — Y9389 Activity, other specified: Secondary | ICD-10-CM | POA: Insufficient documentation

## 2012-11-30 DIAGNOSIS — I10 Essential (primary) hypertension: Secondary | ICD-10-CM | POA: Insufficient documentation

## 2012-11-30 DIAGNOSIS — Y92410 Unspecified street and highway as the place of occurrence of the external cause: Secondary | ICD-10-CM | POA: Insufficient documentation

## 2012-11-30 DIAGNOSIS — E785 Hyperlipidemia, unspecified: Secondary | ICD-10-CM | POA: Insufficient documentation

## 2012-11-30 DIAGNOSIS — Z79899 Other long term (current) drug therapy: Secondary | ICD-10-CM | POA: Insufficient documentation

## 2012-11-30 DIAGNOSIS — S62329A Displaced fracture of shaft of unspecified metacarpal bone, initial encounter for closed fracture: Secondary | ICD-10-CM | POA: Insufficient documentation

## 2012-11-30 DIAGNOSIS — S62307A Unspecified fracture of fifth metacarpal bone, left hand, initial encounter for closed fracture: Secondary | ICD-10-CM

## 2012-11-30 DIAGNOSIS — R110 Nausea: Secondary | ICD-10-CM | POA: Insufficient documentation

## 2012-11-30 LAB — CBC WITH DIFFERENTIAL/PLATELET
Basophils Absolute: 0 10*3/uL (ref 0.0–0.1)
Basophils Relative: 0 % (ref 0–1)
Eosinophils Absolute: 0.2 10*3/uL (ref 0.0–0.7)
Eosinophils Relative: 2 % (ref 0–5)
HCT: 40.5 % (ref 36.0–46.0)
Lymphocytes Relative: 12 % (ref 12–46)
MCH: 25.3 pg — ABNORMAL LOW (ref 26.0–34.0)
MCHC: 32.8 g/dL (ref 30.0–36.0)
MCV: 77.1 fL — ABNORMAL LOW (ref 78.0–100.0)
Monocytes Absolute: 0.6 10*3/uL (ref 0.1–1.0)
RDW: 16.9 % — ABNORMAL HIGH (ref 11.5–15.5)

## 2012-11-30 LAB — COMPREHENSIVE METABOLIC PANEL
AST: 26 U/L (ref 0–37)
CO2: 26 mEq/L (ref 19–32)
Calcium: 9.6 mg/dL (ref 8.4–10.5)
Creatinine, Ser: 0.9 mg/dL (ref 0.50–1.10)
GFR calc non Af Amer: 65 mL/min — ABNORMAL LOW (ref >90)
Total Protein: 6.8 g/dL (ref 6.0–8.3)

## 2012-11-30 MED ORDER — OXYCODONE-ACETAMINOPHEN 5-325 MG PO TABS: 2.0000 | ORAL_TABLET | ORAL | Status: DC | PRN

## 2012-11-30 MED ORDER — IOHEXOL 300 MG/ML  SOLN
100.0000 mL | Freq: Once | INTRAMUSCULAR | Status: AC | PRN
  Administered 2012-11-30: 100 mL via INTRAVENOUS
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
## 2012-11-30 NOTE — Progress Notes (Signed)
Patient called to request a different referral.  She was originally schedule to see Dr Loletta Parish and changed her mind.  She will now be scheduled with Dr Delia Chimes Dohmeier. PG
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
## 2012-11-30 NOTE — ED Notes (Signed)
Patient transported to X-ray 

## 2012-12-01 ENCOUNTER — Ambulatory Visit: Payer: Medicare Other | Admitting: *Deleted

## 2012-12-01 LAB — COMPLETE METABOLIC PANEL WITH GFR
ALT: 20 U/L (ref 0–35)
AST: 22 U/L (ref 0–37)
Albumin: 3.9 g/dL (ref 3.5–5.2)
Alkaline Phosphatase: 65 U/L (ref 39–117)
BUN: 16 mg/dL (ref 6–23)
CO2: 27 mEq/L (ref 19–32)
Calcium: 9.3 mg/dL (ref 8.4–10.5)
Chloride: 102 mEq/L (ref 96–112)
Creat: 0.91 mg/dL (ref 0.50–1.10)
GFR, Est African American: 76 mL/min
GFR, Est Non African American: 65 mL/min
Glucose, Bld: 100 mg/dL — ABNORMAL HIGH (ref 70–99)
Potassium: 4 mEq/L (ref 3.5–5.3)
Sodium: 136 mEq/L (ref 135–145)
Total Bilirubin: 0.7 mg/dL (ref 0.3–1.2)
Total Protein: 6.6 g/dL (ref 6.0–8.3)

## 2012-12-01 LAB — ANEMIA PANEL 7
%SAT: 30 % (ref 20–55)
ABS Retic: 51.8 10*3/uL (ref 19.0–186.0)
Ferritin: 41 ng/mL (ref 10–291)
Folate: 9.7 ng/mL
HCT: 39.6 % (ref 36.0–46.0)
Hemoglobin: 13.2 g/dL (ref 12.0–15.0)
Iron: 89 ug/dL (ref 42–145)
MCH: 25.5 pg — ABNORMAL LOW (ref 26.0–34.0)
MCHC: 33.3 g/dL (ref 30.0–36.0)
MCV: 76.4 fL — ABNORMAL LOW (ref 78.0–100.0)
Platelets: 219 10*3/uL (ref 150–400)
RBC.: 5.18 MIL/uL — ABNORMAL HIGH (ref 3.87–5.11)
RBC: 5.18 MIL/uL — ABNORMAL HIGH (ref 3.87–5.11)
RDW: 16.6 % — ABNORMAL HIGH (ref 11.5–15.5)
Retic Ct Pct: 1 % (ref 0.4–2.3)
TIBC: 300 ug/dL (ref 250–470)
UIBC: 211 ug/dL (ref 125–400)
Vitamin B-12: 407 pg/mL (ref 211–911)
WBC: 5.5 10*3/uL (ref 4.0–10.5)

## 2012-12-01 LAB — TSH: TSH: 1.019 u[IU]/mL (ref 0.350–4.500)

## 2012-12-01 LAB — T4, FREE: Free T4: 1.32 ng/dL (ref 0.80–1.80)

## 2012-12-01 LAB — T3, FREE: T3, Free: 3.8 pg/mL (ref 2.3–4.2)

## 2012-12-05 DIAGNOSIS — M549 Dorsalgia, unspecified: Secondary | ICD-10-CM | POA: Insufficient documentation

## 2012-12-05 NOTE — Assessment & Plan Note (Addendum)
Sent for a MRI of the lumbar spine which showed disc disease so she was referred for further evaluation.

## 2012-12-07 HISTORY — PX: HAND SURGERY: SHX662

## 2012-12-08 ENCOUNTER — Encounter: Payer: Self-pay | Admitting: Family Medicine

## 2012-12-08 ENCOUNTER — Ambulatory Visit (INDEPENDENT_AMBULATORY_CARE_PROVIDER_SITE_OTHER): Payer: Medicare Other | Admitting: Family Medicine

## 2012-12-08 VITALS — BP 153/83 | HR 72 | Resp 16 | Ht 66.0 in | Wt 158.0 lb

## 2012-12-08 DIAGNOSIS — E876 Hypokalemia: Secondary | ICD-10-CM

## 2012-12-08 DIAGNOSIS — E039 Hypothyroidism, unspecified: Secondary | ICD-10-CM

## 2012-12-08 DIAGNOSIS — I10 Essential (primary) hypertension: Secondary | ICD-10-CM

## 2012-12-08 DIAGNOSIS — E785 Hyperlipidemia, unspecified: Secondary | ICD-10-CM

## 2012-12-08 MED ORDER — POTASSIUM CHLORIDE CRYS ER 10 MEQ PO TBCR: 20.0000 meq | EXTENDED_RELEASE_TABLET | Freq: Two times a day (BID) | ORAL | Status: DC

## 2012-12-08 MED ORDER — LOSARTAN POTASSIUM 100 MG PO TABS: 100.0000 mg | ORAL_TABLET | Freq: Every day | ORAL | Status: DC

## 2012-12-08 MED ORDER — ROSUVASTATIN CALCIUM 20 MG PO TABS: 20.0000 mg | ORAL_TABLET | Freq: Every day | ORAL | Status: DC
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
## 2012-12-14 ENCOUNTER — Ambulatory Visit (INDEPENDENT_AMBULATORY_CARE_PROVIDER_SITE_OTHER): Payer: Medicare Other | Admitting: *Deleted

## 2012-12-14 DIAGNOSIS — Z23 Encounter for immunization: Secondary | ICD-10-CM

## 2012-12-14 MED ORDER — LIOTHYRONINE SODIUM 5 MCG PO TABS: 5.0000 ug | ORAL_TABLET | Freq: Every day | ORAL | Status: DC

## 2012-12-30 ENCOUNTER — Encounter: Payer: Self-pay | Admitting: Neurology

## 2012-12-30 ENCOUNTER — Ambulatory Visit (INDEPENDENT_AMBULATORY_CARE_PROVIDER_SITE_OTHER): Payer: Medicare Other | Admitting: Neurology

## 2012-12-30 VITALS — BP 85/68 | HR 71 | Ht 66.0 in | Wt 153.0 lb

## 2012-12-30 DIAGNOSIS — R5381 Other malaise: Secondary | ICD-10-CM

## 2012-12-30 DIAGNOSIS — M5430 Sciatica, unspecified side: Secondary | ICD-10-CM

## 2012-12-30 DIAGNOSIS — M5431 Sciatica, right side: Secondary | ICD-10-CM | POA: Insufficient documentation

## 2012-12-30 DIAGNOSIS — G609 Hereditary and idiopathic neuropathy, unspecified: Secondary | ICD-10-CM

## 2012-12-30 HISTORY — DX: Sciatica, right side: M54.31

## 2012-12-30 NOTE — Patient Instructions (Signed)
Sciatica with Rehab The sciatic nerve runs from the back down the leg and is responsible for sensation and control of the muscles in the back (posterior) side of the thigh, lower leg, and foot. Sciatica is a condition that is characterized by inflammation of this nerve.  SYMPTOMS   Signs of nerve damage, including numbness and/or weakness along the posterior side of the lower extremity.  Pain in the back of the thigh that may also travel down the leg.  Pain that worsens when sitting for long periods of time.  Occasionally, pain in the back or buttock. CAUSES  Inflammation of the sciatic nerve is the cause of sciatica. The inflammation is due to something irritating the nerve. Common sources of irritation include:  Sitting for long periods of time.  Direct trauma to the nerve.  Arthritis of the spine.  Herniated or ruptured disk.  Slipping of the vertebrae (spondylolithesis)  Pressure from soft tissues, such as muscles or ligament-like tissue (fascia). RISK INCREASES WITH:  Sports that place pressure or stress on the spine (football or weightlifting).  Poor strength and flexibility.  Failure to warm-up properly before activity.  Family history of low back pain or disk disorders.  Previous back injury or surgery.  Poor body mechanics, especially when lifting, or poor posture. PREVENTION   Warm up and stretch properly before activity.  Maintain physical fitness:  Strength, flexibility, and endurance.  Cardiovascular fitness.  Learn and use proper technique, especially with posture and lifting. When possible, have coach correct improper technique.  Avoid activities that place stress on the spine. PROGNOSIS If treated properly, then sciatica usually resolves within 6 weeks. However, occasionally surgery is necessary.  RELATED COMPLICATIONS   Permanent nerve damage, including pain, numbness, tingle, or weakness.  Chronic back pain.  Risks of surgery: infection,  bleeding, nerve damage, or damage to surrounding tissues. TREATMENT Treatment initially involves resting from any activities that aggravate your symptoms. The use of ice and medication may help reduce pain and inflammation. The use of strengthening and stretching exercises may help reduce pain with activity. These exercises may be performed at home or with referral to a therapist. A therapist may recommend further treatments, such as transcutaneous electronic nerve stimulation (TENS) or ultrasound. Your caregiver may recommend corticosteroid injections to help reduce inflammation of the sciatic nerve. If symptoms persist despite non-surgical (conservative) treatment, then surgery may be recommended. MEDICATION  If pain medication is necessary, then nonsteroidal anti-inflammatory medications, such as aspirin and ibuprofen, or other minor pain relievers, such as acetaminophen, are often recommended.  Do not take pain medication for 7 days before surgery.  Prescription pain relievers may be given if deemed necessary by your caregiver. Use only as directed and only as much as you need.  Ointments applied to the skin may be helpful.  Corticosteroid injections may be given by your caregiver. These injections should be reserved for the most serious cases, because they may only be given a certain number of times. HEAT AND COLD  Cold treatment (icing) relieves pain and reduces inflammation. Cold treatment should be applied for 10 to 15 minutes every 2 to 3 hours for inflammation and pain and immediately after any activity that aggravates your symptoms. Use ice packs or massage the area with a piece of ice (ice massage).  Heat treatment may be used prior to performing the stretching and strengthening activities prescribed by your caregiver, physical therapist, or athletic trainer. Use a heat pack or soak the injury in warm water. SEEK   MEDICAL CARE IF:  Treatment seems to offer no benefit, or the condition  worsens.  Any medications produce adverse side effects. EXERCISES  RANGE OF MOTION (ROM) AND STRETCHING EXERCISES - Sciatica Most people with sciatic will find that their symptoms worsen with either excessive bending forward (flexion) or arching at the low back (extension). The exercises which will help resolve your symptoms will focus on the opposite motion. Your physician, physical therapist or athletic trainer will help you determine which exercises will be most helpful to resolve your low back pain. Do not complete any exercises without first consulting with your clinician. Discontinue any exercises which worsen your symptoms until you speak to your clinician. If you have pain, numbness or tingling which travels down into your buttocks, leg or foot, the goal of the therapy is for these symptoms to move closer to your back and eventually resolve. Occasionally, these leg symptoms will get better, but your low back pain may worsen; this is typically an indication of progress in your rehabilitation. Be certain to be very alert to any changes in your symptoms and the activities in which you participated in the 24 hours prior to the change. Sharing this information with your clinician will allow him/her to most efficiently treat your condition. These exercises may help you when beginning to rehabilitate your injury. Your symptoms may resolve with or without further involvement from your physician, physical therapist or athletic trainer. While completing these exercises, remember:   Restoring tissue flexibility helps normal motion to return to the joints. This allows healthier, less painful movement and activity.  An effective stretch should be held for at least 30 seconds.  A stretch should never be painful. You should only feel a gentle lengthening or release in the stretched tissue. FLEXION RANGE OF MOTION AND STRETCHING EXERCISES: STRETCH  Flexion, Single Knee to Chest   Lie on a firm bed or floor  with both legs extended in front of you.  Keeping one leg in contact with the floor, bring your opposite knee to your chest. Hold your leg in place by either grabbing behind your thigh or at your knee.  Pull until you feel a gentle stretch in your low back. Hold __________ seconds.  Slowly release your grasp and repeat the exercise with the opposite side. Repeat __________ times. Complete this exercise __________ times per day.  STRETCH  Flexion, Double Knee to Chest  Lie on a firm bed or floor with both legs extended in front of you.  Keeping one leg in contact with the floor, bring your opposite knee to your chest.  Tense your stomach muscles to support your back and then lift your other knee to your chest. Hold your legs in place by either grabbing behind your thighs or at your knees.  Pull both knees toward your chest until you feel a gentle stretch in your low back. Hold __________ seconds.  Tense your stomach muscles and slowly return one leg at a time to the floor. Repeat __________ times. Complete this exercise __________ times per day.  STRETCH  Low Trunk Rotation   Lie on a firm bed or floor. Keeping your legs in front of you, bend your knees so they are both pointed toward the ceiling and your feet are flat on the floor.  Extend your arms out to the side. This will stabilize your upper body by keeping your shoulders in contact with the floor.  Gently and slowly drop both knees together to one side until you   feel a gentle stretch in your low back. Hold for __________ seconds.  Tense your stomach muscles to support your low back as you bring your knees back to the starting position. Repeat the exercise to the other side. Repeat __________ times. Complete this exercise __________ times per day  EXTENSION RANGE OF MOTION AND FLEXIBILITY EXERCISES: STRETCH  Extension, Prone on Elbows  Lie on your stomach on the floor, a bed will be too soft. Place your palms about shoulder  width apart and at the height of your head.  Place your elbows under your shoulders. If this is too painful, stack pillows under your chest.  Allow your body to relax so that your hips drop lower and make contact more completely with the floor.  Hold this position for __________ seconds.  Slowly return to lying flat on the floor. Repeat __________ times. Complete this exercise __________ times per day.  RANGE OF MOTION  Extension, Prone Press Ups  Lie on your stomach on the floor, a bed will be too soft. Place your palms about shoulder width apart and at the height of your head.  Keeping your back as relaxed as possible, slowly straighten your elbows while keeping your hips on the floor. You may adjust the placement of your hands to maximize your comfort. As you gain motion, your hands will come more underneath your shoulders.  Hold this position __________ seconds.  Slowly return to lying flat on the floor. Repeat __________ times. Complete this exercise __________ times per day.  STRENGTHENING EXERCISES - Sciatica  These exercises may help you when beginning to rehabilitate your injury. These exercises should be done near your "sweet spot." This is the neutral, low-back arch, somewhere between fully rounded and fully arched, that is your least painful position. When performed in this safe range of motion, these exercises can be used for people who have either a flexion or extension based injury. These exercises may resolve your symptoms with or without further involvement from your physician, physical therapist or athletic trainer. While completing these exercises, remember:   Muscles can gain both the endurance and the strength needed for everyday activities through controlled exercises.  Complete these exercises as instructed by your physician, physical therapist or athletic trainer. Progress with the resistance and repetition exercises only as your caregiver advises.  You may  experience muscle soreness or fatigue, but the pain or discomfort you are trying to eliminate should never worsen during these exercises. If this pain does worsen, stop and make certain you are following the directions exactly. If the pain is still present after adjustments, discontinue the exercise until you can discuss the trouble with your clinician. STRENGTHENING Deep Abdominals, Pelvic Tilt   Lie on a firm bed or floor. Keeping your legs in front of you, bend your knees so they are both pointed toward the ceiling and your feet are flat on the floor.  Tense your lower abdominal muscles to press your low back into the floor. This motion will rotate your pelvis so that your tail bone is scooping upwards rather than pointing at your feet or into the floor.  With a gentle tension and even breathing, hold this position for __________ seconds. Repeat __________ times. Complete this exercise __________ times per day.  STRENGTHENING  Abdominals, Crunches   Lie on a firm bed or floor. Keeping your legs in front of you, bend your knees so they are both pointed toward the ceiling and your feet are flat on the floor. Cross   your arms over your chest.  Slightly tip your chin down without bending your neck.  Tense your abdominals and slowly lift your trunk high enough to just clear your shoulder blades. Lifting higher can put excessive stress on the low back and does not further strengthen your abdominal muscles.  Control your return to the starting position. Repeat __________ times. Complete this exercise __________ times per day.  STRENGTHENING  Quadruped, Opposite UE/LE Lift  Assume a hands and knees position on a firm surface. Keep your hands under your shoulders and your knees under your hips. You may place padding under your knees for comfort.  Find your neutral spine and gently tense your abdominal muscles so that you can maintain this position. Your shoulders and hips should form a rectangle that  is parallel with the floor and is not twisted.  Keeping your trunk steady, lift your right hand no higher than your shoulder and then your left leg no higher than your hip. Make sure you are not holding your breath. Hold this position __________ seconds.  Continuing to keep your abdominal muscles tense and your back steady, slowly return to your starting position. Repeat with the opposite arm and leg. Repeat __________ times. Complete this exercise __________ times per day.  STRENGTHENING  Abdominals and Quadriceps, Straight Leg Raise   Lie on a firm bed or floor with both legs extended in front of you.  Keeping one leg in contact with the floor, bend the other knee so that your foot can rest flat on the floor.  Find your neutral spine, and tense your abdominal muscles to maintain your spinal position throughout the exercise.  Slowly lift your straight leg off the floor about 6 inches for a count of 15, making sure to not hold your breath.  Still keeping your neutral spine, slowly lower your leg all the way to the floor. Repeat this exercise with each leg __________ times. Complete this exercise __________ times per day. POSTURE AND BODY MECHANICS CONSIDERATIONS - Sciatica Keeping correct posture when sitting, standing or completing your activities will reduce the stress put on different body tissues, allowing injured tissues a chance to heal and limiting painful experiences. The following are general guidelines for improved posture. Your physician or physical therapist will provide you with any instructions specific to your needs. While reading these guidelines, remember:  The exercises prescribed by your provider will help you have the flexibility and strength to maintain correct postures.  The correct posture provides the optimal environment for your joints to work. All of your joints have less wear and tear when properly supported by a spine with good posture. This means you will  experience a healthier, less painful body.  Correct posture must be practiced with all of your activities, especially prolonged sitting and standing. Correct posture is as important when doing repetitive low-stress activities (typing) as it is when doing a single heavy-load activity (lifting). RESTING POSITIONS Consider which positions are most painful for you when choosing a resting position. If you have pain with flexion-based activities (sitting, bending, stooping, squatting), choose a position that allows you to rest in a less flexed posture. You would want to avoid curling into a fetal position on your side. If your pain worsens with extension-based activities (prolonged standing, working overhead), avoid resting in an extended position such as sleeping on your stomach. Most people will find more comfort when they rest with their spine in a more neutral position, neither too rounded nor too arched. Lying   on a non-sagging bed on your side with a pillow between your knees, or on your back with a pillow under your knees will often provide some relief. Keep in mind, being in any one position for a prolonged period of time, no matter how correct your posture, can still lead to stiffness. PROPER SITTING POSTURE In order to minimize stress and discomfort on your spine, you must sit with correct posture Sitting with good posture should be effortless for a healthy body. Returning to good posture is a gradual process. Many people can work toward this most comfortably by using various supports until they have the flexibility and strength to maintain this posture on their own. When sitting with proper posture, your ears will fall over your shoulders and your shoulders will fall over your hips. You should use the back of the chair to support your upper back. Your low back will be in a neutral position, just slightly arched. You may place a small pillow or folded towel at the base of your low back for support.  When  working at a desk, create an environment that supports good, upright posture. Without extra support, muscles fatigue and lead to excessive strain on joints and other tissues. Keep these recommendations in mind: CHAIR:   A chair should be able to slide under your desk when your back makes contact with the back of the chair. This allows you to work closely.  The chair's height should allow your eyes to be level with the upper part of your monitor and your hands to be slightly lower than your elbows. BODY POSITION  Your feet should make contact with the floor. If this is not possible, use a foot rest.  Keep your ears over your shoulders. This will reduce stress on your neck and low back. INCORRECT SITTING POSTURES   If you are feeling tired and unable to assume a healthy sitting posture, do not slouch or slump. This puts excessive strain on your back tissues, causing more damage and pain. Healthier options include:  Using more support, like a lumbar pillow.  Switching tasks to something that requires you to be upright or walking.  Talking a brief walk.  Lying down to rest in a neutral-spine position. PROLONGED STANDING WHILE SLIGHTLY LEANING FORWARD  When completing a task that requires you to lean forward while standing in one place for a long time, place either foot up on a stationary 2-4 inch high object to help maintain the best posture. When both feet are on the ground, the low back tends to lose its slight inward curve. If this curve flattens (or becomes too large), then the back and your other joints will experience too much stress, fatigue more quickly and can cause pain.  CORRECT STANDING POSTURES Proper standing posture should be assumed with all daily activities, even if they only take a few moments, like when brushing your teeth. As in sitting, your ears should fall over your shoulders and your shoulders should fall over your hips. You should keep a slight tension in your abdominal  muscles to brace your spine. Your tailbone should point down to the ground, not behind your body, resulting in an over-extended swayback posture.  INCORRECT STANDING POSTURES  Common incorrect standing postures include a forward head, locked knees and/or an excessive swayback. WALKING Walk with an upright posture. Your ears, shoulders and hips should all line-up. PROLONGED ACTIVITY IN A FLEXED POSITION When completing a task that requires you to bend forward at your   waist or lean over a low surface, try to find a way to stabilize 3 of 4 of your limbs. You can place a hand or elbow on your thigh or rest a knee on the surface you are reaching across. This will provide you more stability so that your muscles do not fatigue as quickly. By keeping your knees relaxed, or slightly bent, you will also reduce stress across your low back. CORRECT LIFTING TECHNIQUES DO :   Assume a wide stance. This will provide you more stability and the opportunity to get as close as possible to the object which you are lifting.  Tense your abdominals to brace your spine; then bend at the knees and hips. Keeping your back locked in a neutral-spine position, lift using your leg muscles. Lift with your legs, keeping your back straight.  Test the weight of unknown objects before attempting to lift them.  Try to keep your elbows locked down at your sides in order get the best strength from your shoulders when carrying an object.  Always ask for help when lifting heavy or awkward objects. INCORRECT LIFTING TECHNIQUES DO NOT:   Lock your knees when lifting, even if it is a small object.  Bend and twist. Pivot at your feet or move your feet when needing to change directions.  Assume that you cannot safely pick up a paperclip without proper posture. Document Released: 02/25/2005 Document Revised: 05/20/2011 Document Reviewed: 06/09/2008 [facility] Patient Information 2014 [place], [place]. Peripheral Neuropathy Peripheral  neuropathy is a common disorder of your nerves resulting from damage. CAUSES  This disorder may be caused by a disease of the nerves or illness. Many neuropathies have well known causes such as:  Diabetes. This is one of the most common causes.   Uremia.   AIDS.   Nutritional deficiencies.   Other causes include mechanical pressures. These may be from:   Compression.   Injury.   Contusions or bruises.   Fracture or dislocated bones.   Pressure involving the nerves close to the surface. Nerves such as the ulnar, or radial can be injured by prolonged use of crutches.  Other injuries may come from:  Tumor.   Hemorrhage or bleeding into a nerve.   Exposure to cold or radiation.   Certain medicines or toxic substances (rare).   Vascular or collagen disorders such as:   Atherosclerosis.   Systemic lupus erythematosus.   Scleroderma.   Sarcoidosis.   Rheumatoid arthritis.   Polyarteritis nodosa.   A large number of cases are of unknown cause.  SYMPTOMS  Common problems include:  Weakness.   Numbness.   Abnormal sensations (paresthesia) such as:   Burning.   Tickling.   Pricking.   Tingling.   Pain in the arms, hands, legs and/or feet.  TREATMENT  Therapy for this disorder differs depending on the cause. It may vary from medical treatment with medications or physical therapy among others.   For example, therapy for this disorder caused by diabetes involves control of the diabetes.   In cases where a tumor or ruptured disc is the cause, therapy may involve surgery. This would be to remove the tumor or to repair the ruptured disc.   In entrapment or compression neuropathy, treatment may consist of splinting or surgical decompression of the ulnar or median nerves. A common example of entrapment neuropathy is carpal tunnel syndrome. This has become more common because of the increasing use of computers.   Peroneal and radial compression neuropathies may  require  avoidance of pressure.   Physical therapy and/or splints may be useful in preventing contractures. This is a condition in which shortened muscles around joints cause abnormal and sometimes painful positioning of the joints.  Document Released: 02/15/2002 Document Revised: 11/07/2010 Document Reviewed: 02/25/2005 [facility] Patient Information 2012 [place], [place].
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
## 2013-01-01 ENCOUNTER — Encounter (INDEPENDENT_AMBULATORY_CARE_PROVIDER_SITE_OTHER): Payer: Self-pay

## 2013-01-01 ENCOUNTER — Ambulatory Visit (INDEPENDENT_AMBULATORY_CARE_PROVIDER_SITE_OTHER): Payer: Medicare Other | Admitting: Neurology

## 2013-01-01 DIAGNOSIS — M5430 Sciatica, unspecified side: Secondary | ICD-10-CM

## 2013-01-01 DIAGNOSIS — Z0289 Encounter for other administrative examinations: Secondary | ICD-10-CM

## 2013-01-01 DIAGNOSIS — M5431 Sciatica, right side: Secondary | ICD-10-CM

## 2013-01-01 DIAGNOSIS — M5416 Radiculopathy, lumbar region: Secondary | ICD-10-CM | POA: Insufficient documentation

## 2013-01-01 DIAGNOSIS — R5381 Other malaise: Secondary | ICD-10-CM

## 2013-01-01 DIAGNOSIS — G609 Hereditary and idiopathic neuropathy, unspecified: Secondary | ICD-10-CM

## 2013-01-01 DIAGNOSIS — R209 Unspecified disturbances of skin sensation: Secondary | ICD-10-CM

## 2013-01-01 NOTE — Procedures (Signed)
    [facility]  NCS (NERVE CONDUCTION STUDY) WITH EMG (ELECTROMYOGRAPHY) REPORT   STUDY DATE: October 24th  2014 PATIENT NAME: Michelle Bruce DOB: April 28, 1945 MRN: [id number]    TECHNOLOGIST: Gearldine Shown ELECTROMYOGRAPHER: Levert Feinstein M.D.  CLINICAL INFORMATION:   67 year old female, with history of bilateral feet paresthesia,bilateral feet muscle cramping On examination:  bilateral lower extremity muscle strength was normal, deep tendon reflex was normal and symmetric  FINDINGS: NERVE CONDUCTION STUDY: Bilateral peroneal sensory responses were normal. Bilateral peroneal to EDB, and the tibial motor responses were normal.  Bilateral tibial H. reflexes were normal and symmetric       NEEDLE ELECTROMYOGRAPHY: Selected needle examination was performed at right lower extremity muscles and right lumbosacral paraspinal muscles.  Needle examination of right tibialis anterior, tibialis posterior, medial gastrocnemius, vastus lateralis, biceps femoris long head was normal  There was no spontaneous activity at right lumbosacral paraspinal muscles, right L4, L5, S1  IMPRESSION:  This is a normal study. There is no electrodiagnostic evidence of large fiber peripheral neuropathy, or right lumbar radiculopathy    INTERPRETING PHYSICIAN:   Levert Feinstein M.D. Ph.D. [facility] [address] [place], [place] [address] [phone]

## 2013-01-05 NOTE — Progress Notes (Signed)
Quick Note:  Shared NCV/EMG result findings with patient thru VM message ______

## 2013-01-11 ENCOUNTER — Telehealth: Payer: Self-pay | Admitting: Neurology

## 2013-01-11 NOTE — Telephone Encounter (Signed)
Spoke with patient and she stated that she did not receive AVS summary and wanted one mailed to her home address along with NCV/EMG,wanted to know if sciatica nerve is causing the numbness and tingling she is having

## 2013-01-11 NOTE — Telephone Encounter (Signed)
Called and shared message with patient, stated she will look over the AVS and call back if any other questions.

## 2013-01-11 NOTE — Telephone Encounter (Signed)
Please call. Normal test results tell us only that there is no measurable nerve impingement or iritation. A small fiber neuropathy will not be measured in this test.  This test did not confirm sciatica.  Will discuss further once she has the report. Thanks.

## 2013-02-20 DIAGNOSIS — E039 Hypothyroidism, unspecified: Secondary | ICD-10-CM | POA: Insufficient documentation

## 2013-02-20 DIAGNOSIS — E785 Hyperlipidemia, unspecified: Secondary | ICD-10-CM | POA: Insufficient documentation

## 2013-02-20 DIAGNOSIS — E876 Hypokalemia: Secondary | ICD-10-CM | POA: Insufficient documentation

## 2013-02-20 DIAGNOSIS — I10 Essential (primary) hypertension: Secondary | ICD-10-CM | POA: Insufficient documentation

## 2013-03-26 ENCOUNTER — Telehealth: Payer: Self-pay | Admitting: Nurse Practitioner

## 2013-03-26 NOTE — Telephone Encounter (Signed)
Patient has a lot of questions about upcoming test she is scheduled for. She would like a call back from the nurse.

## 2013-03-29 NOTE — Telephone Encounter (Signed)
Reminder call to PT regarding appt on 1/21.  She stated she was supposed to be working on her balance but due to hand surgery with a cast and splint she wasn't sure if she should do both.  She rescheduled appointment to 3/4 @ 11:00 am so that she could be working on that. She stated that she did not need a call back as I informed her that the visit on 1/21 was a follow-up to see how she was doing.

## 2013-03-29 NOTE — Telephone Encounter (Signed)
Please advise 

## 2013-03-30 NOTE — Telephone Encounter (Signed)
Advice on what ? She can reschedule her appointment with me , if the PT input is needed to evaluate her condition. Is that the question. ?

## 2013-03-31 ENCOUNTER — Ambulatory Visit: Payer: Self-pay | Admitting: Nurse Practitioner

## 2013-05-12 ENCOUNTER — Ambulatory Visit (INDEPENDENT_AMBULATORY_CARE_PROVIDER_SITE_OTHER): Payer: Medicare Other | Admitting: Nurse Practitioner

## 2013-05-12 ENCOUNTER — Encounter: Payer: Self-pay | Admitting: Nurse Practitioner

## 2013-05-12 VITALS — BP 120/70 | HR 57 | Ht 65.0 in | Wt 154.0 lb

## 2013-05-12 DIAGNOSIS — M5430 Sciatica, unspecified side: Secondary | ICD-10-CM

## 2013-05-12 DIAGNOSIS — G629 Polyneuropathy, unspecified: Secondary | ICD-10-CM

## 2013-05-12 DIAGNOSIS — M5431 Sciatica, right side: Secondary | ICD-10-CM

## 2013-05-12 DIAGNOSIS — G609 Hereditary and idiopathic neuropathy, unspecified: Secondary | ICD-10-CM

## 2013-05-12 DIAGNOSIS — R5381 Other malaise: Secondary | ICD-10-CM

## 2013-05-12 DIAGNOSIS — R5383 Other fatigue: Secondary | ICD-10-CM
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
## 2013-05-12 NOTE — Patient Instructions (Signed)
We will check labs today, B12 and and Methlymalonic Acid.  Follow up as needed.

## 2013-05-14 LAB — C-REACTIVE PROTEIN: CRP: 5.1 mg/L — ABNORMAL HIGH (ref 0.0–4.9)

## 2013-05-14 LAB — METHYLMALONIC ACID, SERUM: Methylmalonic Acid: 137 nmol/L (ref 0–378)

## 2013-05-17 NOTE — Progress Notes (Signed)
Quick Note:  Pt called back and I let her know the lab results normal. Get B12 retested in 6 mo. She verbalized understanding. ______

## 2013-05-26 ENCOUNTER — Other Ambulatory Visit: Payer: Self-pay | Admitting: *Deleted

## 2013-05-26 DIAGNOSIS — I10 Essential (primary) hypertension: Secondary | ICD-10-CM

## 2013-05-26 MED ORDER — LOSARTAN POTASSIUM 100 MG PO TABS: 100.0000 mg | ORAL_TABLET | Freq: Every day | ORAL | Status: DC

## 2013-05-26 NOTE — Progress Notes (Signed)
Called pharmacy to correct her Losartan 100 mg refill.  It should be 30 day supply with no refill. PG

## 2013-05-26 NOTE — Telephone Encounter (Signed)
Patient has an appointment on 06/16/13. Her Losartan was refilled for 30 days with no refill. PG

## 2013-05-31 ENCOUNTER — Other Ambulatory Visit: Payer: Self-pay | Admitting: *Deleted

## 2013-05-31 DIAGNOSIS — E039 Hypothyroidism, unspecified: Secondary | ICD-10-CM

## 2013-05-31 DIAGNOSIS — R5381 Other malaise: Secondary | ICD-10-CM

## 2013-05-31 DIAGNOSIS — E785 Hyperlipidemia, unspecified: Secondary | ICD-10-CM

## 2013-05-31 DIAGNOSIS — R5383 Other fatigue: Principal | ICD-10-CM

## 2013-06-01 ENCOUNTER — Other Ambulatory Visit: Payer: Medicare Other

## 2013-06-01 LAB — COMPLETE METABOLIC PANEL WITH GFR
ALT: 16 U/L (ref 0–35)
AST: 19 U/L (ref 0–37)
Albumin: 3.8 g/dL (ref 3.5–5.2)
Alkaline Phosphatase: 63 U/L (ref 39–117)
BUN: 17 mg/dL (ref 6–23)
CO2: 27 mEq/L (ref 19–32)
Calcium: 9.1 mg/dL (ref 8.4–10.5)
Chloride: 103 mEq/L (ref 96–112)
Creat: 0.69 mg/dL (ref 0.50–1.10)
GFR, Est African American: >89 mL/min
GFR, Est Non African American: >89 mL/min
Glucose, Bld: 94 mg/dL (ref 70–99)
Potassium: 3.7 mEq/L (ref 3.5–5.3)
Sodium: 139 mEq/L (ref 135–145)
Total Bilirubin: 0.5 mg/dL (ref 0.2–1.2)
Total Protein: 6.2 g/dL (ref 6.0–8.3)

## 2013-06-01 LAB — LIPID PANEL
Cholesterol: 162 mg/dL (ref 0–200)
HDL: 63 mg/dL (ref >39)
LDL Cholesterol: 71 mg/dL (ref 0–99)
Total CHOL/HDL Ratio: 2.6 Ratio
Triglycerides: 138 mg/dL (ref <150)
VLDL: 28 mg/dL (ref 0–40)

## 2013-06-01 LAB — CBC WITH DIFFERENTIAL/PLATELET
Basophils Absolute: 0.1 10*3/uL (ref 0.0–0.1)
Basophils Relative: 1 % (ref 0–1)
Eosinophils Absolute: 0.2 10*3/uL (ref 0.0–0.7)
Eosinophils Relative: 4 % (ref 0–5)
HCT: 39.6 % (ref 36.0–46.0)
Hemoglobin: 13 g/dL (ref 12.0–15.0)
Lymphocytes Relative: 20 % (ref 12–46)
Lymphs Abs: 1.1 10*3/uL (ref 0.7–4.0)
MCH: 24.8 pg — ABNORMAL LOW (ref 26.0–34.0)
MCHC: 32.8 g/dL (ref 30.0–36.0)
MCV: 75.4 fL — ABNORMAL LOW (ref 78.0–100.0)
Monocytes Absolute: 0.5 10*3/uL (ref 0.1–1.0)
Monocytes Relative: 8 % (ref 3–12)
Neutro Abs: 3.8 10*3/uL (ref 1.7–7.7)
Neutrophils Relative %: 67 % (ref 43–77)
Platelets: 185 10*3/uL (ref 150–400)
RBC: 5.25 MIL/uL — ABNORMAL HIGH (ref 3.87–5.11)
RDW: 16.8 % — ABNORMAL HIGH (ref 11.5–15.5)
WBC: 5.7 10*3/uL (ref 4.0–10.5)

## 2013-06-01 LAB — T4, FREE: Free T4: 1.36 ng/dL (ref 0.80–1.80)

## 2013-06-01 LAB — TSH: TSH: 2.283 u[IU]/mL (ref 0.350–4.500)

## 2013-06-01 LAB — T3, FREE: T3, Free: 3.8 pg/mL (ref 2.3–4.2)

## 2013-06-06 ENCOUNTER — Encounter: Payer: Self-pay | Admitting: Family Medicine

## 2013-06-10 ENCOUNTER — Ambulatory Visit (INDEPENDENT_AMBULATORY_CARE_PROVIDER_SITE_OTHER): Payer: Medicare Other | Admitting: Family Medicine

## 2013-06-10 ENCOUNTER — Encounter: Payer: Self-pay | Admitting: Family Medicine

## 2013-06-10 VITALS — BP 112/68 | HR 48 | Resp 16 | Wt 153.0 lb

## 2013-06-10 DIAGNOSIS — E876 Hypokalemia: Secondary | ICD-10-CM

## 2013-06-10 DIAGNOSIS — E785 Hyperlipidemia, unspecified: Secondary | ICD-10-CM

## 2013-06-10 DIAGNOSIS — E039 Hypothyroidism, unspecified: Secondary | ICD-10-CM

## 2013-06-10 DIAGNOSIS — I10 Essential (primary) hypertension: Secondary | ICD-10-CM

## 2013-06-10 MED ORDER — POTASSIUM CHLORIDE CRYS ER 10 MEQ PO TBCR: 20.0000 meq | EXTENDED_RELEASE_TABLET | Freq: Two times a day (BID) | ORAL | Status: AC

## 2013-06-10 MED ORDER — LIOTHYRONINE SODIUM 5 MCG PO TABS: 5.0000 ug | ORAL_TABLET | Freq: Every day | ORAL | Status: DC

## 2013-06-10 MED ORDER — TRIAMTERENE-HCTZ 37.5-25 MG PO TABS: 1.0000 | ORAL_TABLET | Freq: Every day | ORAL | Status: AC

## 2013-06-10 MED ORDER — LEVOTHYROXINE SODIUM 75 MCG PO TABS: 75.0000 ug | ORAL_TABLET | Freq: Every day | ORAL | Status: AC

## 2013-06-10 MED ORDER — ROSUVASTATIN CALCIUM 20 MG PO TABS: 10.0000 mg | ORAL_TABLET | Freq: Every day | ORAL | Status: AC

## 2013-06-10 MED ORDER — LOSARTAN POTASSIUM 100 MG PO TABS
100.0000 mg | ORAL_TABLET | Freq: Every day | ORAL | Status: DC
Start: 2013-06-10 — End: 2019-04-29

## 2013-06-10 NOTE — Progress Notes (Signed)
Subjective:    Patient ID: Michelle Bruce, female    DOB: 10-Aug-1945, 68 y.o.   MRN: [id number]  HPI  Michelle Hatchnn is here today to go over her most recent lab results and to discuss the conditions listed below:   1)  Hypertension - She is taking Triam/HCTZ (37.5-25)  and Losartan (100 mg) daily.  She needs both medications refilled.   2)  Hyperlipidemia - She continues to do well with Crestor (20 mg 1/4 pill). She needs a refill on it.    3)  Hypothyroidsm - She is doing well with the combination of Cytomel and levothyroxine.     Review of Systems  Constitutional: Negative for activity change, fatigue and unexpected weight change.  HENT: Negative.   Eyes: Negative.   Respiratory: Negative for shortness of breath.   Cardiovascular: Negative for chest pain, palpitations and leg swelling.  Gastrointestinal: Negative for diarrhea and constipation.  Endocrine: Negative.   Genitourinary: Negative for difficulty urinating.  Musculoskeletal: Negative.   Skin: Negative.   Neurological: Negative.   Hematological: Negative for adenopathy. Does not bruise/bleed easily.  Psychiatric/Behavioral: Negative for sleep disturbance and dysphoric mood. The patient is not nervous/anxious.      Past Medical History  Diagnosis Date  . Hypertension   . Hyperlipemia   . Hypothyroidism   . Ocular hypertension   . TMJ (dislocation of temporomandibular joint)   . Myocardial infarction     ? Takotsubo  . DDD (degenerative disc disease), lumbar   . Sciatica of right side without back pain 12/30/2012     Past Surgical History  Procedure Laterality Date  . Abdominal hysterectomy    . Tonsillectomy    . 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 Abdominal sacrocolpopexy    . Dilation and curettage of uterus    . Breast biopsy    . Hand surgery Left 12/07/12     History   Social History Narrative   Marital Status: Married Scientist, research (physical sciences)(Don)   Children:  G2 P2002   Pets:  None    Living Situation: Lives with husband    Occupation: Retired   Education: 16   Tobacco Use/Exposure:  None    Alcohol Use:  Occasional   Drug Use:  None   Diet:  Regular   Exercise:  Walking    Hobbies: Traveling, Reading     Family History  Problem Relation Age of Onset  . Coronary artery disease Mother 2480  . Hyperlipidemia Mother   . Hypertension Mother   . Prostate cancer Father   . Hypertension Father   . Hyperlipidemia Father      Current Outpatient Prescriptions on File Prior to Visit  Medication Sig Dispense Refill  . aspirin EC 81 MG tablet Take 81 mg by mouth once.        . calcium citrate-vitamin D (CITRACAL+D) 315-200 MG-UNIT per tablet Take 1-2 tablets by mouth daily.       Marland Kitchen. estradiol (VIVELLE-DOT) 0.1 MG/24HR Place 1 patch onto the skin 2 (two) times a week. Change on Wednesday and Saturday      . potassium chloride (K-DUR) 10 MEQ tablet       . timolol (TIMOPTIC) 0.5 % ophthalmic solution       . TRAVATAN Z 0.004 % SOLN ophthalmic solution       . zolpidem (AMBIEN) 10 MG tablet Take 5 mg by mouth at bedtime.        No current facility-administered medications on file prior  to visit.     Allergies  Allergen Reactions  . Atenolol Hydrochloride Cough  . Vicodin [Hydrocodone-Acetaminophen]     Hyper      Immunization History  Administered Date(s) Administered  . Influenza,inj,Quad PF,36+ Mos 12/14/2012  . Td 11/12/2001  . Tdap 12/16/2006  . Zoster 05/28/2006        Objective:   Physical Exam  Vitals reviewed. Constitutional: She is oriented to person, place, and time.  Eyes: Conjunctivae are normal. No scleral icterus.  Neck: Neck supple. No thyromegaly present.  Cardiovascular: Normal rate, regular rhythm and normal heart sounds.   Pulmonary/Chest: Effort normal and breath sounds normal.  Musculoskeletal: She exhibits no edema and no tenderness.  Lymphadenopathy:    She has no cervical adenopathy.  Neurological: She is alert and oriented to person, place, and time.  Skin: Skin  is warm and dry.  Psychiatric: She has a normal mood and affect. Her behavior is normal. Judgment and thought content normal.       Assessment & Plan:    Blakelyn was seen today for medication management.  She is doing well on her current medications and will remain on them.  She was given refills on all of her medications.    Diagnoses and associated orders for this visit:   Unspecified hypothyroidism - levothyroxine (SYNTHROID, LEVOTHROID) 75 MCG tablet; Take 1 tablet (75 mcg total) by mouth daily. - liothyronine (CYTOMEL) 5 MCG tablet; Take 1 tablet (5 mcg total) by mouth daily.  Essential hypertension, benign - losartan (COZAAR) 100 MG tablet; Take 1 tablet (100 mg total) by mouth daily. - triamterene-hydrochlorothiazide (MAXZIDE-25) 37.5-25 MG per tablet; Take 1 tablet by mouth daily.  Other and unspecified hyperlipidemia - rosuvastatin (CRESTOR) 20 MG tablet; Take 0.5 tablets (10 mg total) by mouth daily.  Potassium deficiency - potassium chloride (K-DUR,KLOR-CON) 10 MEQ tablet; Take 2 tablets (20 mEq total) by mouth 2 (two) times daily.  TIME SPENT "FACE TO FACE" WITH PATIENT -  30 MINS

## 2013-06-16 ENCOUNTER — Ambulatory Visit: Payer: Medicare Other | Admitting: Family Medicine

## 2013-06-21 ENCOUNTER — Ambulatory Visit: Payer: Medicare Other | Admitting: Family Medicine

## 2013-10-12 ENCOUNTER — Ambulatory Visit: Payer: Medicare Other | Admitting: Family Medicine

## 2013-10-12 ENCOUNTER — Ambulatory Visit (INDEPENDENT_AMBULATORY_CARE_PROVIDER_SITE_OTHER): Payer: Medicare Other | Admitting: Family Medicine

## 2013-10-12 VITALS — BP 115/71 | HR 72 | Wt 160.0 lb

## 2013-10-12 DIAGNOSIS — L089 Local infection of the skin and subcutaneous tissue, unspecified: Secondary | ICD-10-CM

## 2013-10-12 DIAGNOSIS — B3731 Acute candidiasis of vulva and vagina: Secondary | ICD-10-CM

## 2013-10-12 DIAGNOSIS — B373 Candidiasis of vulva and vagina: Secondary | ICD-10-CM

## 2013-10-12 MED ORDER — TERCONAZOLE 0.8 % VA CREA: 1.0000 | TOPICAL_CREAM | Freq: Every day | VAGINAL | Status: AC

## 2013-10-12 MED ORDER — DOXYCYCLINE HYCLATE 100 MG PO CAPS: 100.0000 mg | ORAL_CAPSULE | Freq: Two times a day (BID) | ORAL | Status: AC

## 2013-10-12 MED ORDER — FLUCONAZOLE 150 MG PO TABS
150.0000 mg | ORAL_TABLET | Freq: Once | ORAL | Status: AC
Start: 2013-10-12 — End: 2014-10-12

## 2013-10-12 MED ORDER — MUPIROCIN 2 % EX OINT: TOPICAL_OINTMENT | CUTANEOUS | Status: AC

## 2013-11-08 ENCOUNTER — Encounter: Payer: Self-pay | Admitting: Family Medicine

## 2013-11-08 NOTE — Progress Notes (Signed)
Subjective:    Patient ID: Michelle Bruce, female    DOB: Mar 26, 1945, 68 y.o.   MRN: [id number]  HPI  Amiel is here today to have a skin lesion evaluated.  It is located on her right inner thigh.  She first noticed it about a week ago.  It was larger a few days ago.  It seems to have drained some and is smaller than it was.  She just wants to make sure that this is not anything to worry about.       Review of Systems  Constitutional: Negative for fever.  Skin: Positive for wound (Right Inner Thigh).     Past Medical History  Diagnosis Date  . Hypertension   . Hyperlipemia   . Hypothyroidism   . Ocular hypertension   . TMJ (dislocation of temporomandibular joint)   . Myocardial infarction     ? Takotsubo  . DDD (degenerative disc disease), lumbar   . Sciatica of right side without back pain 12/30/2012     Past Surgical History  Procedure Laterality Date  . Abdominal hysterectomy    . Tonsillectomy    . [place] / Teacher, early years/pre    . Abdominal sacrocolpopexy    . Dilation and curettage of uterus    . Breast biopsy    . Hand surgery Left 12/07/12     History   Social History Narrative   Marital Status: Married Scientist, research (physical sciences))   Children:  G2 P2002   Pets:  None    Living Situation: Lives with husband    Occupation: Retired   Education: 16   Tobacco Use/Exposure:  None    Alcohol Use:  Occasional   Drug Use:  None   Diet:  Regular   Exercise:  Walking    Hobbies: Traveling, Reading     Family History  Problem Relation Age of Onset  . Coronary artery disease Mother 74  . Hyperlipidemia Mother   . Hypertension Mother   . Prostate cancer Father   . Hypertension Father   . Hyperlipidemia Father      Current Outpatient Prescriptions on File Prior to Visit  Medication Sig Dispense Refill  . aspirin EC 81 MG tablet Take 81 mg by mouth once.        . calcium citrate-vitamin D (CITRACAL+D) 315-200 MG-UNIT per tablet Take 1-2 tablets by mouth daily.       Marland Kitchen estradiol  (VIVELLE-DOT) 0.1 MG/24HR Place 1 patch onto the skin 2 (two) times a week. Change on Wednesday and Saturday      . levothyroxine (SYNTHROID, LEVOTHROID) 75 MCG tablet Take 1 tablet (75 mcg total) by mouth daily.  30 tablet  11  . liothyronine (CYTOMEL) 5 MCG tablet Take 1 tablet (5 mcg total) by mouth daily.  30 tablet  11  . losartan (COZAAR) 100 MG tablet Take 1 tablet (100 mg total) by mouth daily.  30 tablet  11  . potassium chloride (K-DUR) 10 MEQ tablet       . potassium chloride (K-DUR,KLOR-CON) 10 MEQ tablet Take 2 tablets (20 mEq total) by mouth 2 (two) times daily.  120 tablet  11  . rosuvastatin (CRESTOR) 20 MG tablet Take 0.5 tablets (10 mg total) by mouth daily.  30 tablet  5  . timolol (TIMOPTIC) 0.5 % ophthalmic solution       . TRAVATAN Z 0.004 % SOLN ophthalmic solution       . triamterene-hydrochlorothiazide (MAXZIDE-25) 37.5-25 MG per tablet Take 1 tablet  by mouth daily.  90 tablet  3  . zolpidem (AMBIEN) 10 MG tablet Take 5 mg by mouth at bedtime.        No current facility-administered medications on file prior to visit.     Allergies  Allergen Reactions  . Atenolol Hydrochloride Cough  . Vicodin [Hydrocodone-Acetaminophen]     Hyper      Immunization History  Administered Date(s) Administered  . Influenza,inj,Quad PF,36+ Mos 12/14/2012  . Td 11/12/2001  . Tdap 12/16/2006  . Zoster 05/28/2006       Objective:   Physical Exam  Skin:             Assessment & Plan:    Hafsa was seen today for skin lesion.  Diagnoses and associated orders for this visit:  Infected skin lesion Comments: She is to apply Bactroban 2 times per day for a week or so.  If the lesion does not resolve or worsens, she'll add the doxycycline.  If it does not completely resolve after the antibiotics then she's to discuss this with her dermatologist.   - mupirocin ointment (BACTROBAN) 2 %; Apply to affected area 3 times daily - doxycycline (VIBRAMYCIN) 100 MG capsule; Take 1  capsule (100 mg total) by mouth 2 (two) times daily.  Candidiasis of genitalia in female - fluconazole (DIFLUCAN) 150 MG tablet; Take 1 tablet (150 mg total) by mouth once. - terconazole (TERAZOL 3) 0.8 % vaginal cream; Place 1 applicator vaginally at bedtime. Use for 3 days

## 2013-12-10 ENCOUNTER — Encounter: Payer: Medicare Other | Admitting: Family Medicine

## 2014-02-17 ENCOUNTER — Ambulatory Visit: Payer: Medicare Other | Admitting: Family Medicine

## 2014-02-17 ENCOUNTER — Encounter (HOSPITAL_COMMUNITY): Payer: Self-pay | Admitting: Cardiology

## 2018-05-04 NOTE — Progress Notes (Signed)
[facility] Note  05/05/2018     CHIEF COMPLAINT Patient presents for Retina Evaluation   HISTORY OF PRESENT ILLNESS: Michelle Bruce is a 73 y.o. female who presents to the clinic today for:   HPI    Retina Evaluation    In both eyes.  Duration of 2 weeks.  Associated Symptoms Floaters.  Negative for Fatigue, Jaw Claudication, Photophobia, Distortion, Blind Spot, Shoulder/Hip pain, Glare, Pain, Trauma, Fever, Scalp Tenderness, Redness, Flashes and Weight Loss.  Treatments tried include no treatments.  I, the attending physician,  performed the HPI with the patient and updated documentation appropriately.          Comments    Ret Eval per Dr. Clent Jacks. Patient see's Dr.Groat every 6 months for Ocular hypertension. Patient states vision is stable. Patient is taking AREDS2       Last edited by Bernarda Caffey, MD on 05/06/2018 10:04 PM. (History)    pt states she is a regular pt of Dr. Clent Jacks, she states 7 years ago someone else in his office sent her to see Dr. Celesta Aver for macular degeneration and was told to take AREDS 2, she states she has been taking those ever since then, she states when she looks at her amsler grid some of the lines look thick instead of thin, pt states she has ocular HTN and is on 3 different medications for it  Referring physician: Clent Jacks, MD [address] [place], [place] [address]  HISTORICAL INFORMATION:   Selected notes from the MEDICAL RECORD NUMBER Referred by Dr. Clent Jacks for concern of non-exu ARMD OU LEE: 02.12.20 (R. Groat) [BCVA: OU: 20/40] Ocular Hx-ocular HTN, cataract OU PMH-HLD, HTN, hypothyroidism    CURRENT MEDICATIONS: Current Outpatient Medications (Ophthalmic Drugs)  Medication Sig  . timolol (TIMOPTIC) 0.5 % ophthalmic solution   . TRAVATAN Z 0.004 % SOLN ophthalmic solution    No current facility-administered medications for this visit.  (Ophthalmic Drugs)   Current Outpatient  Medications (Other)  Medication Sig  . calcium citrate-vitamin D (CITRACAL+D) 315-200 MG-UNIT per tablet Take 1-2 tablets by mouth daily.   Marland Kitchen estradiol (VIVELLE-DOT) 0.1 MG/24HR Place 1 patch onto the skin 2 (two) times a week. Change on Wednesday and Saturday  . levothyroxine (SYNTHROID, LEVOTHROID) 100 MCG tablet Take 100 mcg by mouth daily before breakfast.  . liothyronine (CYTOMEL) 5 MCG tablet Take 5 mcg by mouth daily.  . potassium chloride (K-DUR,KLOR-CON) 10 MEQ tablet Take 10 mEq by mouth 2 (two) times daily.  Marland Kitchen telmisartan-hydrochlorothiazide (MICARDIS HCT) 40-12.5 MG tablet Take 1 tablet by mouth daily.  Marland Kitchen aspirin EC 81 MG tablet Take 81 mg by mouth once.    Marland Kitchen levothyroxine (SYNTHROID, LEVOTHROID) 75 MCG tablet Take 1 tablet (75 mcg total) by mouth daily. (Patient taking differently: Take 75 mcg by mouth daily. )  . liothyronine (CYTOMEL) 5 MCG tablet Take 1 tablet (5 mcg total) by mouth daily.  Marland Kitchen losartan (COZAAR) 100 MG tablet Take 1 tablet (100 mg total) by mouth daily.  . potassium chloride (K-DUR) 10 MEQ tablet   . potassium chloride (K-DUR,KLOR-CON) 10 MEQ tablet Take 2 tablets (20 mEq total) by mouth 2 (two) times daily.  . rosuvastatin (CRESTOR) 20 MG tablet Take 0.5 tablets (10 mg total) by mouth daily.  Marland Kitchen zolpidem (AMBIEN) 10 MG tablet Take 5 mg by mouth at bedtime.    No current facility-administered medications for this visit.  (Other)  REVIEW OF SYSTEMS: ROS    Positive for: Gastrointestinal, Musculoskeletal, Endocrine, Cardiovascular, Eyes   Negative for: Constitutional, Neurological, Skin, HENT, Respiratory, Psychiatric, Allergic/Imm, Heme/Lymph   Last edited by Elmore Guise on 05/05/2018  1:58 PM. (History)       ALLERGIES Allergies  Allergen Reactions  . Atenolol Hydrochloride Cough  . Vicodin [Hydrocodone-Acetaminophen]     Hyper     PAST MEDICAL HISTORY Past Medical History:  Diagnosis Date  . Cataract   . DDD (degenerative disc disease),  lumbar   . Glaucoma   . Hyperlipemia   . Hypertension   . Hypothyroidism   . Macular degeneration   . Myocardial infarction [facility])    ? Takotsubo  . Ocular hypertension   . Sciatica of right side without back pain 12/30/2012  . TMJ (dislocation of temporomandibular joint)    Past Surgical History:  Procedure Laterality Date  . ABDOMINAL HYSTERECTOMY    . ABDOMINAL SACROCOLPOPEXY    . [place] / PERIGEE REPAIR    . BREAST BIOPSY    . DILATION AND CURETTAGE OF UTERUS    . HAND SURGERY Left 12/07/12  . LEFT HEART CATHETERIZATION WITH CORONARY ANGIOGRAM N/A 01/18/2011   Procedure: LEFT HEART CATHETERIZATION WITH CORONARY ANGIOGRAM;  Surgeon: Larey Dresser, MD;  Location: [facility] CATH LAB;  Service: Cardiovascular;  Laterality: N/A;  . TONSILLECTOMY      FAMILY HISTORY Family History  Problem Relation Age of Onset  . Coronary artery disease Mother 54  . Hyperlipidemia Mother   . Hypertension Mother   . Prostate cancer Father   . Hypertension Father   . Hyperlipidemia Father     SOCIAL HISTORY Social History   Tobacco Use  . Smoking status: Never Smoker  . Smokeless tobacco: Never Used  Substance Use Topics  . Alcohol use: Yes    Alcohol/week: 1.0 standard drinks    Types: 1 Glasses of wine per week  . Drug use: No         OPHTHALMIC EXAM:  Base Eye Exam    Visual Acuity (Snellen - Linear)      Right Left   Dist cc 20/40-2 20/40   Dist ph cc 20/NI 20/30       Tonometry (Tonopen, 2:11 PM)      Right Left   Pressure 15 13       Pupils      Dark Light Shape React APD   Right 4 3 Round Brisk None   Left 4 3 Round Brisk None       Visual Fields (Counting fingers)      Left Right    Full Full       Extraocular Movement      Right Left    Full, Ortho Full, Ortho       Neuro/Psych    Oriented x3:  Yes   Mood/Affect:  Normal       Dilation    Both eyes:  1.0% Mydriacyl, 2.5% Phenylephrine @ 2:11 PM        Slit Lamp and Fundus Exam    Slit Lamp  Exam      Right Left   Lids/Lashes Dermatochalasis - upper lid, Meibomian gland dysfunction Dermatochalasis - upper lid, Meibomian gland dysfunction   Conjunctiva/Sclera White and quiet White and quiet   Cornea Arcus, 1-2+ Punctate epithelial erosions Arcus, 1-2+ Punctate epithelial erosions   Anterior Chamber Deep and quiet Deep and quiet   Iris Round and dilated Round and dilated  Lens 2-3+ Nuclear sclerosis, 2-3+ Cortical cataract 3+ Nuclear sclerosis with early brunescence, 3+ Cortical cataract   Vitreous Vitreous syneresis, Posterior vitreous detachment Vitreous syneresis       Fundus Exam      Right Left   Disc Sharp rim, inferior rim thinning Pink and Sharp   C/D Ratio 0.55 0.55   Macula Flat, Good foveal reflex, Drusen, PEDs, Retinal pigment epithelial mottling Flat, Good foveal reflex, Drusen, PEDs, Retinal pigment epithelial mottling   Vessels Vascular attenuation Vascular attenuation   Periphery Attached, mild reticular degeneration, IT Retinoschisis Attached, shallow IT  Retinoschisis        Refraction    Wearing Rx      Sphere Cylinder Axis Add   Right -2.75 +1.25 089 +2.50   Left -2.00 +1.25 073 +2.50       Manifest Refraction (Auto)      Sphere Cylinder Axis Dist VA   Right -2.25 +0.05 080 20/30-2   Left -2.50 +1.00 073 20/25-3          IMAGING AND PROCEDURES  Imaging and Procedures for @TODAY @  OCT, Retina - OU - Both Eyes       Right Eye Quality was good. Central Foveal Thickness: 304. Progression has no prior data. Findings include retinal drusen , no SRF, normal foveal contour, no IRF, outer retinal atrophy, pigment epithelial detachment.   Left Eye Quality was good. Central Foveal Thickness: 295. Progression has no prior data. Findings include normal foveal contour, no SRF, no IRF, retinal drusen , pigment epithelial detachment.   Notes *Images captured and stored on drive  Diagnosis / Impression:  Non-exu ARMD OU   Clinical management:   See below  Abbreviations: NFP - Normal foveal profile. CME - cystoid macular edema. PED - pigment epithelial detachment. IRF - intraretinal fluid. SRF - subretinal fluid. EZ - ellipsoid zone. ERM - epiretinal membrane. ORA - outer retinal atrophy. ORT - outer retinal tubulation. SRHM - subretinal hyper-reflective material                 ASSESSMENT/PLAN:    ICD-10-CM   1. Intermediate stage nonexudative age-related macular degeneration of both eyes H35.3132   2. Retinal edema H35.81 OCT, Retina - OU - Both Eyes  3. Essential hypertension I10   4. Hypertensive retinopathy of both eyes H35.033   5. Ocular hypertension, bilateral H40.053   6. Combined forms of age-related cataract of both eyes H25.813     1,2. Intermediate stage age related macular degeneration, non-exudative, both eyes  - The incidence, anatomy, and pathology of dry AMD, risk of progression, and the AREDS and AREDS 2 study including smoking risks discussed with patient.  - Recommend amsler grid monitoring  - f/u 4 month  3,4. Hypertensive retinopathy OU  - discussed importance of tight BP control  - monitor  5. Ocular Hypertension  - IOP good today -- 15, 13  - takes timolol, travatan, trusopt  - under the management of Dr. Clent Jacks  6. Mixed form age related cataract  - The symptoms of cataract, surgical options, and treatments and risks were discussed with patient.  - discussed diagnosis and progression  - not yet visually significant  - under the expert management of Dr. Katy Fitch   Ophthalmic Meds Ordered this visit:  No orders of the defined types were placed in this encounter.      Return in about 4 months (around 09/03/2018) for f/u non-exu ARMD OU, DFE, OCT.  There are no Patient  Instructions on file for this visit.   Explained the diagnoses, plan, and follow up with the patient and they expressed understanding.  Patient expressed understanding of the importance of proper follow up care.     This document serves as a record of services personally performed by Gardiner Sleeper, MD, PhD. It was created on their behalf by Ernest Mallick, OA, an ophthalmic assistant. The creation of this record is the provider's dictation and/or activities during the visit.    Electronically signed by: Ernest Mallick, OA  02.24.2020 10:19 PM    Gardiner Sleeper, M.D., Ph.D. Diseases & Surgery of the Retina and Vitreous [facility]  I have reviewed the above documentation for accuracy and completeness, and I agree with the above. Gardiner Sleeper, M.D., Ph.D. 05/06/18 10:22 PM    Abbreviations: M myopia (nearsighted); A astigmatism; H hyperopia (farsighted); P presbyopia; Mrx spectacle prescription;  CTL contact lenses; OD right eye; OS left eye; OU both eyes  XT exotropia; ET esotropia; PEK punctate epithelial keratitis; PEE punctate epithelial erosions; DES dry eye syndrome; MGD meibomian gland dysfunction; ATs artificial tears; PFAT's preservative free artificial tears; [place] nuclear sclerotic cataract; PSC posterior subcapsular cataract; ERM epi-retinal membrane; PVD posterior vitreous detachment; RD retinal detachment; DM diabetes mellitus; DR diabetic retinopathy; NPDR non-proliferative diabetic retinopathy; PDR proliferative diabetic retinopathy; CSME clinically significant macular edema; DME diabetic macular edema; dbh dot blot hemorrhages; CWS cotton wool spot; POAG primary open angle glaucoma; C/D cup-to-disc ratio; HVF humphrey visual field; GVF goldmann visual field; OCT optical coherence tomography; IOP intraocular pressure; BRVO Branch retinal vein occlusion; CRVO central retinal vein occlusion; CRAO central retinal artery occlusion; BRAO branch retinal artery occlusion; RT retinal tear; SB scleral buckle; PPV pars plana vitrectomy; VH Vitreous hemorrhage; PRP panretinal laser photocoagulation; IVK intravitreal kenalog; VMT vitreomacular traction; MH Macular hole;  NVD  neovascularization of the disc; NVE neovascularization elsewhere; AREDS age related eye disease study; ARMD age related macular degeneration; POAG primary open angle glaucoma; EBMD epithelial/anterior basement membrane dystrophy; ACIOL anterior chamber intraocular lens; IOL intraocular lens; PCIOL posterior chamber intraocular lens; Phaco/IOL phacoemulsification with intraocular lens placement; [place] photorefractive keratectomy; LASIK laser assisted in situ keratomileusis; HTN hypertension; DM diabetes mellitus; COPD chronic obstructive pulmonary disease

## 2018-05-05 ENCOUNTER — Encounter (INDEPENDENT_AMBULATORY_CARE_PROVIDER_SITE_OTHER): Payer: Self-pay | Admitting: Ophthalmology

## 2018-05-05 ENCOUNTER — Ambulatory Visit (INDEPENDENT_AMBULATORY_CARE_PROVIDER_SITE_OTHER): Payer: Medicare Other | Admitting: Ophthalmology

## 2018-05-05 DIAGNOSIS — H353132 Nonexudative age-related macular degeneration, bilateral, intermediate dry stage: Secondary | ICD-10-CM

## 2018-05-05 DIAGNOSIS — H3581 Retinal edema: Secondary | ICD-10-CM | POA: Diagnosis not present

## 2018-05-05 DIAGNOSIS — I10 Essential (primary) hypertension: Secondary | ICD-10-CM | POA: Diagnosis not present

## 2018-05-05 DIAGNOSIS — H25813 Combined forms of age-related cataract, bilateral: Secondary | ICD-10-CM

## 2018-05-05 DIAGNOSIS — H35033 Hypertensive retinopathy, bilateral: Secondary | ICD-10-CM | POA: Diagnosis not present

## 2018-05-05 DIAGNOSIS — H40053 Ocular hypertension, bilateral: Secondary | ICD-10-CM

## 2018-05-06 ENCOUNTER — Encounter (INDEPENDENT_AMBULATORY_CARE_PROVIDER_SITE_OTHER): Payer: Self-pay | Admitting: Ophthalmology

## 2018-06-26 ENCOUNTER — Telehealth (INDEPENDENT_AMBULATORY_CARE_PROVIDER_SITE_OTHER): Payer: Self-pay | Admitting: Ophthalmology

## 2018-06-26 NOTE — Telephone Encounter (Signed)
Patient called in reporting changes on amsler grid OD.

## 2018-06-29 ENCOUNTER — Encounter (INDEPENDENT_AMBULATORY_CARE_PROVIDER_SITE_OTHER): Payer: Medicare Other | Admitting: Ophthalmology

## 2018-09-02 NOTE — Progress Notes (Signed)
[facility] Note  09/04/2018     CHIEF COMPLAINT Patient presents for Retina Follow Up   HISTORY OF PRESENT ILLNESS: Michelle Bruce is a 73 y.o. female who presents to the clinic today for:   HPI    Retina Follow Up    Patient presents with  Dry AMD.  In both eyes.  Severity is moderate.  Duration of 4 months.  Since onset it is gradually worsening.  I, the attending physician,  performed the HPI with the patient and updated documentation appropriately.          Comments    Patient states vision slightly worse OU. History of cataracts OU, patient concerned cataracts may be getting worse. Uses AT's for dry eye, seems to help with dryness. Also using dorzolamide, timolol and travatan for glaucoma OU-treated by Dr. Katy Fitch.       Last edited by Bernarda Caffey, MD on 09/04/2018 11:19 AM. (History)    pt states she feels like her vision is worse, but thinks it may be because of her cataracts, she states her eyes itch and water a lot, but when she uses OTC drops, they seem to help   Referring physician: Clent Jacks, MD [address] [place],  [place] [address]  HISTORICAL INFORMATION:   Selected notes from the MEDICAL RECORD NUMBER Referred by Dr. Clent Jacks for concern of non-exu ARMD OU LEE: 02.12.20 (R. Groat) [BCVA: OU: 20/40] Ocular Hx-ocular HTN, cataract OU PMH-HLD, HTN, hypothyroidism    CURRENT MEDICATIONS: Current Outpatient Medications (Ophthalmic Drugs)  Medication Sig  . dorzolamide (TRUSOPT) 2 % ophthalmic solution Place 1 drop into both eyes 2 (two) times daily.  . timolol (TIMOPTIC) 0.5 % ophthalmic solution Place 1 drop into both eyes daily.   . TRAVATAN Z 0.004 % SOLN ophthalmic solution Place 1 drop into both eyes at bedtime.    No current facility-administered medications for this visit.  (Ophthalmic Drugs)   Current Outpatient Medications (Other)  Medication Sig  . aspirin EC 81 MG tablet Take 81 mg by mouth once.    .  calcium citrate-vitamin D (CITRACAL+D) 315-200 MG-UNIT per tablet Take 1-2 tablets by mouth daily.   Marland Kitchen estradiol (VIVELLE-DOT) 0.1 MG/24HR Place 1 patch onto the skin 2 (two) times a week. Change on Wednesday and Saturday  . levothyroxine (SYNTHROID, LEVOTHROID) 100 MCG tablet Take 100 mcg by mouth daily before breakfast.  . liothyronine (CYTOMEL) 5 MCG tablet Take 5 mcg by mouth daily.  . potassium chloride (K-DUR) 10 MEQ tablet   . potassium chloride (K-DUR,KLOR-CON) 10 MEQ tablet Take 2 tablets (20 mEq total) by mouth 2 (two) times daily.  . potassium chloride (K-DUR,KLOR-CON) 10 MEQ tablet Take 10 mEq by mouth 2 (two) times daily.  Marland Kitchen telmisartan-hydrochlorothiazide (MICARDIS HCT) 40-12.5 MG tablet Take 1 tablet by mouth daily.  Marland Kitchen zolpidem (AMBIEN) 10 MG tablet Take 5 mg by mouth at bedtime.   Marland Kitchen levothyroxine (SYNTHROID, LEVOTHROID) 75 MCG tablet Take 1 tablet (75 mcg total) by mouth daily. (Patient taking differently: Take 75 mcg by mouth daily. )  . liothyronine (CYTOMEL) 5 MCG tablet Take 1 tablet (5 mcg total) by mouth daily.  Marland Kitchen losartan (COZAAR) 100 MG tablet Take 1 tablet (100 mg total) by mouth daily.  . rosuvastatin (CRESTOR) 20 MG tablet Take 0.5 tablets (10 mg total) by mouth daily.   No current facility-administered medications for this visit.  (Other)      REVIEW OF  SYSTEMS: ROS    Positive for: Gastrointestinal, Musculoskeletal, Endocrine, Cardiovascular, Eyes   Negative for: Constitutional, Neurological, Skin, HENT, Respiratory, Psychiatric, Allergic/Imm, Heme/Lymph   Last edited by Annalee GentaBarber, Daryl D on 09/04/2018 10:16 AM. (History)       ALLERGIES Allergies  Allergen Reactions  . Atenolol Hydrochloride Cough  . Vicodin [Hydrocodone-Acetaminophen]     Hyper     PAST MEDICAL HISTORY Past Medical History:  Diagnosis Date  . Cataract   . DDD (degenerative disc disease), lumbar   . Glaucoma   . Hyperlipemia   . Hypertension   . Hypothyroidism   . Macular  degeneration   . Myocardial infarction [facility])    ? [place]  . Ocular hypertension   . Sciatica of right side without back pain 12/30/2012  . TMJ (dislocation of temporomandibular joint)    Past Surgical History:  Procedure Laterality Date  . ABDOMINAL HYSTERECTOMY    . ABDOMINAL SACROCOLPOPEXY    . APOGEE / PERIGEE REPAIR    . BREAST BIOPSY    . DILATION AND CURETTAGE OF UTERUS    . HAND SURGERY Left 12/07/12  . LEFT HEART CATHETERIZATION WITH CORONARY ANGIOGRAM N/A 01/18/2011   Procedure: LEFT HEART CATHETERIZATION WITH CORONARY ANGIOGRAM;  Surgeon: Laurey Moralealton S McLean, MD;  [facility];  Service: Cardiovascular;  Laterality: N/A;  . TONSILLECTOMY      FAMILY HISTORY Family History  Problem Relation Age of Onset  . Coronary artery disease Mother 3080  . Hyperlipidemia Mother   . Hypertension Mother   . Prostate cancer Father   . Hypertension Father   . Hyperlipidemia Father     SOCIAL HISTORY Social History   Tobacco Use  . Smoking status: Never Smoker  . Smokeless tobacco: Never Used  Substance Use Topics  . Alcohol use: Yes    Alcohol/week: 1.0 standard drinks    Types: 1 Glasses of wine per week  . Drug use: No         OPHTHALMIC EXAM:  Base Eye Exam    Visual Acuity (Snellen - Linear)      Right Left   Dist cc 20/30 -1 20/30 +2   Dist ph cc 20/30 +2 20/25 -2   Correction: Glasses       Tonometry (Tonopen, 10:33 AM)      Right Left   Pressure 19 17       Pupils      Dark Light Shape React APD   Right 4 3 Round Brisk None   Left 4 3 Round Brisk None       Visual Fields (Counting fingers)      Left Right    Full Full       Extraocular Movement      Right Left    Full, Ortho Full, Ortho       Neuro/Psych    Oriented x3: Yes   Mood/Affect: Normal       Dilation    Both eyes: 1.0% Mydriacyl, 2.5% Phenylephrine @ 10:33 AM        Slit Lamp and Fundus Exam    Slit Lamp Exam      Right Left   Lids/Lashes Dermatochalasis - upper  lid, mild Meibomian gland dysfunction Dermatochalasis - upper lid, Meibomian gland dysfunction   Conjunctiva/Sclera White and quiet White and quiet   Cornea Arcus, 1-2+ Punctate epithelial erosions, mild Debris in tear film Arcus, 2+ inferior Punctate epithelial erosions   Anterior Chamber Deep and quiet Deep and quiet  Iris Round and dilated Round and dilated   Lens 2-3+ Nuclear sclerosis, 2-3+ Cortical cataract 3+ Nuclear sclerosis with early brunescence, 3+ Cortical cataract   Vitreous Vitreous syneresis, Posterior vitreous detachment Vitreous syneresis       Fundus Exam      Right Left   Disc Sharp rim, inferior rim thinning Pink and Sharp   C/D Ratio 0.55 0.5   Macula Flat, blunted foveal reflex, scattered Drusen, PEDs, Retinal pigment epithelial mottling, No heme or edema Flat, blunted foveal reflex, Drusen, PEDs, Retinal pigment epithelial mottling   Vessels Vascular attenuation Vascular attenuation   Periphery Attached, mild reticular degeneration, IT Retinoschisis Attached, shallow IT  Retinoschisis        Refraction    Wearing Rx      Sphere Cylinder Axis Add   Right -2.75 +1.25 089 +2.50   Left -2.00 +1.25 073 +2.50       Manifest Refraction      Sphere Cylinder Axis Dist VA   Right -2.75 +1.25 012 20/40   Left -2.00 +1.25 075 20/30          IMAGING AND PROCEDURES  Imaging and Procedures for @TODAY @  OCT, Retina - OU - Both Eyes       Right Eye Quality was good. Central Foveal Thickness: 301. Progression has been stable. Findings include retinal drusen , no SRF, no IRF, outer retinal atrophy, pigment epithelial detachment, abnormal foveal contour.   Left Eye Quality was good. Central Foveal Thickness: 293. Progression has been stable. Findings include normal foveal contour, no SRF, no IRF, retinal drusen , pigment epithelial detachment, outer retinal atrophy.   Notes *Images captured and stored on drive  Diagnosis / Impression:  Non-exu ARMD OU - no  significant change from prior   Clinical management:  See below  Abbreviations: NFP - Normal foveal profile. CME - cystoid macular edema. PED - pigment epithelial detachment. IRF - intraretinal fluid. SRF - subretinal fluid. EZ - ellipsoid zone. ERM - epiretinal membrane. ORA - outer retinal atrophy. ORT - outer retinal tubulation. SRHM - subretinal hyper-reflective material                 ASSESSMENT/PLAN:    ICD-10-CM   1. Intermediate stage nonexudative age-related macular degeneration of both eyes  H35.3132   2. Retinal edema  H35.81 OCT, Retina - OU - Both Eyes  3. Essential hypertension  I10   4. Hypertensive retinopathy of both eyes  H35.033   5. Ocular hypertension, bilateral  H40.053   6. Combined forms of age-related cataract of both eyes  H25.813     1,2. Intermediate stage age related macular degeneration, non-exudative, both eyes  - The incidence, anatomy, and pathology of dry AMD, risk of progression, and the AREDS and AREDS 2 study including smoking risks discussed with patient.  - Recommend amsler grid monitoring  - f/u 4-6 months  3,4. Hypertensive retinopathy OU  - discussed importance of tight BP control  - monitor  5. Ocular Hypertension  - IOP good today -- 19, 17  - takes timolol, travatan, trusopt  - under the management of Dr. Ernesto Rutherfordobert Groat  6. Mixed form age related cataract  - The symptoms of cataract, surgical options, and treatments and risks were discussed with patient.  - discussed diagnosis and progression  - approaching visual significanct  - under the expert management of Dr. Dione BoozeGroat  - pt to discuss evaluation with Dr. Dione BoozeGroat   Ophthalmic Meds Ordered this visit:  No  orders of the defined types were placed in this encounter.      Return for f/u 4-6 months, non-exu ARMD OU, DFE, OCT.  There are no Patient Instructions on file for this visit.   Explained the diagnoses, plan, and follow up with the patient and they expressed  understanding.  Patient expressed understanding of the importance of proper follow up care.   This document serves as a record of services personally performed by Karie ChimeraBrian G. Naliyah Neth, MD, PhD. It was created on their behalf by Laurian BrimAmanda Brown, OA, an ophthalmic assistant. The creation of this record is the provider's dictation and/or activities during the visit.    Electronically signed by: Laurian BrimAmanda Brown, OA  06.24.2020 4:35 PM    Karie ChimeraBrian G. Chadd Tollison, M.D., Ph.D. Diseases & Surgery of the Retina and Vitreous [facility]  I have reviewed the above documentation for accuracy and completeness, and I agree with the above. Karie ChimeraBrian G. Rodina Pinales, M.D., Ph.D. 09/05/18 4:35 PM    Abbreviations: M myopia (nearsighted); A astigmatism; H hyperopia (farsighted); P presbyopia; Mrx spectacle prescription;  CTL contact lenses; OD right eye; OS left eye; OU both eyes  XT exotropia; ET esotropia; PEK punctate epithelial keratitis; PEE punctate epithelial erosions; DES dry eye syndrome; MGD meibomian gland dysfunction; ATs artificial tears; PFAT's preservative free artificial tears; NSC nuclear sclerotic cataract; PSC posterior subcapsular cataract; ERM epi-retinal membrane; PVD posterior vitreous detachment; RD retinal detachment; DM diabetes mellitus; DR diabetic retinopathy; NPDR non-proliferative diabetic retinopathy; PDR proliferative diabetic retinopathy; CSME clinically significant macular edema; DME diabetic macular edema; dbh dot blot hemorrhages; CWS cotton wool spot; POAG primary open angle glaucoma; C/D cup-to-disc ratio; HVF humphrey visual field; GVF goldmann visual field; OCT optical coherence tomography; IOP intraocular pressure; BRVO Branch retinal vein occlusion; CRVO central retinal vein occlusion; CRAO central retinal artery occlusion; BRAO branch retinal artery occlusion; RT retinal tear; SB scleral buckle; PPV pars plana vitrectomy; VH Vitreous hemorrhage; PRP panretinal laser  photocoagulation; IVK intravitreal kenalog; VMT vitreomacular traction; MH Macular hole;  NVD neovascularization of the disc; NVE neovascularization elsewhere; AREDS age related eye disease study; ARMD age related macular degeneration; POAG primary open angle glaucoma; EBMD epithelial/anterior basement membrane dystrophy; ACIOL anterior chamber intraocular lens; IOL intraocular lens; PCIOL posterior chamber intraocular lens; Phaco/IOL phacoemulsification with intraocular lens placement; PRK photorefractive keratectomy; LASIK laser assisted in situ keratomileusis; HTN hypertension; DM diabetes mellitus; COPD chronic obstructive pulmonary disease

## 2018-09-04 ENCOUNTER — Other Ambulatory Visit: Payer: Self-pay

## 2018-09-04 ENCOUNTER — Encounter (INDEPENDENT_AMBULATORY_CARE_PROVIDER_SITE_OTHER): Payer: Self-pay | Admitting: Ophthalmology

## 2018-09-04 ENCOUNTER — Ambulatory Visit (INDEPENDENT_AMBULATORY_CARE_PROVIDER_SITE_OTHER): Payer: Medicare Other | Admitting: Ophthalmology

## 2018-09-04 DIAGNOSIS — I10 Essential (primary) hypertension: Secondary | ICD-10-CM

## 2018-09-04 DIAGNOSIS — H35033 Hypertensive retinopathy, bilateral: Secondary | ICD-10-CM | POA: Diagnosis not present

## 2018-09-04 DIAGNOSIS — H3581 Retinal edema: Secondary | ICD-10-CM

## 2018-09-04 DIAGNOSIS — H353132 Nonexudative age-related macular degeneration, bilateral, intermediate dry stage: Secondary | ICD-10-CM | POA: Diagnosis not present

## 2018-09-04 DIAGNOSIS — H25813 Combined forms of age-related cataract, bilateral: Secondary | ICD-10-CM

## 2018-09-04 DIAGNOSIS — H40053 Ocular hypertension, bilateral: Secondary | ICD-10-CM

## 2019-01-28 NOTE — Progress Notes (Signed)
[facility] - Clinic Note  02/02/2019     CHIEF COMPLAINT Patient presents for Retina Follow Up   HISTORY OF PRESENT ILLNESS: Michelle Bruce is a 73 y.o. female who presents to the clinic today for:   HPI    Retina Follow Up    Patient presents with  Dry AMD.  In both eyes.  This started months ago.  Severity is moderate.  Duration of months.  Since onset it is stable.  I, the attending physician,  performed the HPI with the patient and updated documentation appropriately.          Comments    Patient states her vision is about the same OU.  She denies eye pain or discomfort and denies any new or worsening floaters or fol.       Last edited by [name], MD on 02/02/2019 11:55 PM. (History)    pt states she has not seen Dr. Dione Booze since she was here 5 months ago, but she has an appt with him in January 2021, she states she feels like street signs are harder to read now  Referring physician: Ernesto Rutherford, MD [address] [place],  [place] [address]  HISTORICAL INFORMATION:   Selected notes from the MEDICAL RECORD NUMBER Referred by Dr. Ernesto Rutherford for concern of non-exu ARMD OU    CURRENT MEDICATIONS: Current Outpatient Medications (Ophthalmic Drugs)  Medication Sig  . dorzolamide (TRUSOPT) 2 % ophthalmic solution Place 1 drop into both eyes 2 (two) times daily.  . timolol (TIMOPTIC) 0.5 % ophthalmic solution Place 1 drop into both eyes daily.   . TRAVATAN Z 0.004 % SOLN ophthalmic solution Place 1 drop into both eyes at bedtime.    No current facility-administered medications for this visit.  (Ophthalmic Drugs)   Current Outpatient Medications (Other)  Medication Sig  . aspirin EC 81 MG tablet Take 81 mg by mouth once.    . calcium citrate-vitamin D (CITRACAL+D) 315-200 MG-UNIT per tablet Take 1-2 tablets by mouth daily.   Marland Kitchen estradiol (VIVELLE-DOT) 0.1 MG/24HR Place 1 patch onto the skin 2 (two) times a week. Change on Wednesday and Saturday   . levothyroxine (SYNTHROID, LEVOTHROID) 100 MCG tablet Take 100 mcg by mouth daily before breakfast.  . levothyroxine (SYNTHROID, LEVOTHROID) 75 MCG tablet Take 1 tablet (75 mcg total) by mouth daily. (Patient taking differently: Take 75 mcg by mouth daily. )  . liothyronine (CYTOMEL) 5 MCG tablet Take 1 tablet (5 mcg total) by mouth daily.  Marland Kitchen liothyronine (CYTOMEL) 5 MCG tablet Take 5 mcg by mouth daily.  Marland Kitchen losartan (COZAAR) 100 MG tablet Take 1 tablet (100 mg total) by mouth daily.  . potassium chloride (K-DUR) 10 MEQ tablet   . potassium chloride (K-DUR,KLOR-CON) 10 MEQ tablet Take 2 tablets (20 mEq total) by mouth 2 (two) times daily.  . potassium chloride (K-DUR,KLOR-CON) 10 MEQ tablet Take 10 mEq by mouth 2 (two) times daily.  . rosuvastatin (CRESTOR) 20 MG tablet Take 0.5 tablets (10 mg total) by mouth daily.  Marland Kitchen telmisartan-hydrochlorothiazide (MICARDIS HCT) 40-12.5 MG tablet Take 1 tablet by mouth daily.  Marland Kitchen zolpidem (AMBIEN) 10 MG tablet Take 5 mg by mouth at bedtime.    No current facility-administered medications for this visit.  (Other)      REVIEW OF SYSTEMS: ROS    Positive for: Gastrointestinal, Musculoskeletal, Endocrine, Cardiovascular, Eyes   Negative for: Constitutional, Neurological, Skin, HENT, Respiratory, Psychiatric, Allergic/Imm, Heme/Lymph   Last  edited by Corrinne EagleEnglish, Ashley L on 02/02/2019 10:07 AM. (History)       ALLERGIES Allergies  Allergen Reactions  . Atenolol Hydrochloride Cough  . Vicodin [Hydrocodone-Acetaminophen]     Hyper     PAST MEDICAL HISTORY Past Medical History:  Diagnosis Date  . Cataract   . DDD (degenerative disc disease), lumbar   . Glaucoma   . Hyperlipemia   . Hypertension   . Hypothyroidism   . Macular degeneration   . Myocardial infarction [facility](HCC)    ? [place]  . Ocular hypertension   . Sciatica of right side without back pain 12/30/2012  . TMJ (dislocation of temporomandibular joint)    Past Surgical History:   Procedure Laterality Date  . ABDOMINAL HYSTERECTOMY    . ABDOMINAL SACROCOLPOPEXY    . APOGEE / PERIGEE REPAIR    . BREAST BIOPSY    . DILATION AND CURETTAGE OF UTERUS    . HAND SURGERY Left 12/07/12  . LEFT HEART CATHETERIZATION WITH CORONARY ANGIOGRAM N/A 01/18/2011   Procedure: LEFT HEART CATHETERIZATION WITH CORONARY ANGIOGRAM;  Surgeon: Laurey Moralealton S McLean, MD;  [facility];  Service: Cardiovascular;  Laterality: N/A;  . TONSILLECTOMY      FAMILY HISTORY Family History  Problem Relation Age of Onset  . Coronary artery disease Mother [id number]  . Hyperlipidemia Mother   . Hypertension Mother   . Prostate cancer Father   . Hypertension Father   . Hyperlipidemia Father     SOCIAL HISTORY Social History   Tobacco Use  . Smoking status: Never Smoker  . Smokeless tobacco: Never Used  Substance Use Topics  . Alcohol use: Yes    Alcohol/week: 1.0 standard drinks    Types: 1 Glasses of wine per week  . Drug use: No         OPHTHALMIC EXAM:  Base Eye Exam    Visual Acuity (Snellen - Linear)      Right Left   Dist cc 20/40 -1 20/40 -1   Dist ph cc 20/40 +2 NI   Correction: Glasses       Tonometry (Tonopen, 10:10 AM)      Right Left   Pressure 20 21       Pupils      Dark Light Shape React APD   Right 4 3 Round Brisk 0   Left 4 3 Round Brisk 0       Visual Fields      Left Right    Full Full       Extraocular Movement      Right Left    Full Full       Neuro/Psych    Oriented x3: Yes   Mood/Affect: Normal       Dilation    Both eyes: 1.0% Mydriacyl, 2.5% Phenylephrine @ 10:10 AM        Slit Lamp and Fundus Exam    Slit Lamp Exam      Right Left   Lids/Lashes Dermatochalasis - upper lid, mild Meibomian gland dysfunction Dermatochalasis - upper lid, Meibomian gland dysfunction   Conjunctiva/Sclera White and quiet White and quiet   Cornea Arcus, trace Punctate epithelial erosions, slightly decreased TBUT Arcus, 2-3+ inferior Punctate  epithelial erosions, decreased TBUT   Anterior Chamber Deep and quiet Deep and quiet   Iris Round and dilated Round and dilated   Lens 3+ Nuclear sclerosis with brunescence, 2-3+ Cortical cataract 3+ Nuclear sclerosis with early brunescence, 3+ Cortical cataract   Vitreous  Vitreous syneresis, Posterior vitreous detachment Vitreous syneresis       Fundus Exam      Right Left   Disc Sharp rim, inferior rim thinning Pink and Sharp   C/D Ratio 0.55 0.5   Macula Flat, blunted foveal reflex, scattered Drusen, PEDs, Retinal pigment epithelial mottling, No heme or edema Flat, blunted foveal reflex, Drusen, PEDs, Retinal pigment epithelial mottling   Vessels Vascular attenuation Vascular attenuation   Periphery Attached, mild reticular degeneration, IT Retinoschisis -- stable Attached, shallow IT  Retinoschisis        Refraction    Wearing Rx      Sphere Cylinder Axis Add   Right -2.75 +1.25 089 +2.50   Left -2.00 +1.25 073 +2.50          IMAGING AND PROCEDURES  Imaging and Procedures for @TODAY @  OCT, Retina - OU - Both Eyes       Right Eye Quality was good. Central Foveal Thickness: 302. Progression has been stable. Findings include retinal drusen , no SRF, no IRF, outer retinal atrophy, pigment epithelial detachment, abnormal foveal contour.   Left Eye Quality was good. Central Foveal Thickness: 291. Progression has been stable. Findings include normal foveal contour, no SRF, no IRF, retinal drusen , pigment epithelial detachment, outer retinal atrophy.   Notes *Images captured and stored on drive  Diagnosis / Impression:  Non-exu ARMD OU - no significant change from prior   Clinical management:  See below  Abbreviations: NFP - Normal foveal profile. CME - cystoid macular edema. PED - pigment epithelial detachment. IRF - intraretinal fluid. SRF - subretinal fluid. EZ - ellipsoid zone. ERM - epiretinal membrane. ORA - outer retinal atrophy. ORT - outer retinal tubulation.  SRHM - subretinal hyper-reflective material                 ASSESSMENT/PLAN:    ICD-10-CM   1. Intermediate stage nonexudative age-related macular degeneration of both eyes  H35.3132   2. Retinal edema  H35.81 OCT, Retina - OU - Both Eyes  3. Essential hypertension  I10   4. Hypertensive retinopathy of both eyes  H35.033   5. Ocular hypertension, bilateral  H40.053   6. Combined forms of age-related cataract of both eyes  H25.813     1,2. Intermediate stage age related macular degeneration, non-exudative, both eyes  - stable -- no significant change from prior on exam and OCT  - The incidence, anatomy, and pathology of dry AMD, risk of progression, and the AREDS and AREDS 2 study including smoking risks discussed with patient.  - Recommend amsler grid monitoring  - f/u 4-6 months  3,4. Hypertensive retinopathy OU  - discussed importance of tight BP control  - monitor  5. Ocular Hypertension OU  - IOP borderline today -- 20,21  - takes timolol, travatan, trusopt  - under the management of Dr. Clent Jacks  6. Mixed form age related cataract  - The symptoms of cataract, surgical options, and treatments and risks were discussed with patient.  - discussed diagnosis and progression  - +visual significance  - under the expert management of Dr. Katy Fitch  - pt to discuss evaluation with Dr. Katy Fitch -- had to cancel appt this past summer -- now scheduled in January 2021   Ophthalmic Meds Ordered this visit:  No orders of the defined types were placed in this encounter.      Return in about 6 months (around 08/02/2019) for f/u non-exu ARMD OU, DFE, OCT.  There are  no Patient Instructions on file for this visit.   Explained the diagnoses, plan, and follow up with the patient and they expressed understanding.  Patient expressed understanding of the importance of proper follow up care.  This document serves as a record of services personally performed by Karie Chimera, MD,  PhD. It was created on their behalf by Cristopher Estimable, COT an ophthalmic technician. The creation of this record is the provider's dictation and/or activities during the visit.    Electronically signed by: Cristopher Estimable, COT 01/28/19 @ 11:58 PM   This document serves as a record of services personally performed by Karie Chimera, MD, PhD. It was created on their behalf by Laurian Brim, OA, an ophthalmic assistant. The creation of this record is the provider's dictation and/or activities during the visit.    Electronically signed by: Laurian Brim, OA 11.24.2020 11:58 PM  Karie Chimera, M.D., Ph.D. Diseases & Surgery of the Retina and Vitreous [facility] 02/02/2019   I have reviewed the above documentation for accuracy and completeness, and I agree with the above. Karie Chimera, M.D., Ph.D. 02/02/19 11:58 PM    Abbreviations: M myopia (nearsighted); A astigmatism; H hyperopia (farsighted); P presbyopia; Mrx spectacle prescription;  CTL contact lenses; OD right eye; OS left eye; OU both eyes  XT exotropia; ET esotropia; PEK punctate epithelial keratitis; PEE punctate epithelial erosions; DES dry eye syndrome; MGD meibomian gland dysfunction; ATs artificial tears; PFAT's preservative free artificial tears; NSC nuclear sclerotic cataract; PSC posterior subcapsular cataract; ERM epi-retinal membrane; PVD posterior vitreous detachment; RD retinal detachment; DM diabetes mellitus; DR diabetic retinopathy; NPDR non-proliferative diabetic retinopathy; PDR proliferative diabetic retinopathy; CSME clinically significant macular edema; DME diabetic macular edema; dbh dot blot hemorrhages; CWS cotton wool spot; POAG primary open angle glaucoma; C/D cup-to-disc ratio; HVF humphrey visual field; GVF goldmann visual field; OCT optical coherence tomography; IOP intraocular pressure; BRVO Branch retinal vein occlusion; CRVO central retinal vein occlusion; CRAO central retinal artery  occlusion; BRAO branch retinal artery occlusion; RT retinal tear; SB scleral buckle; PPV pars plana vitrectomy; VH Vitreous hemorrhage; PRP panretinal laser photocoagulation; IVK intravitreal kenalog; VMT vitreomacular traction; MH Macular hole;  NVD neovascularization of the disc; NVE neovascularization elsewhere; AREDS age related eye disease study; ARMD age related macular degeneration; POAG primary open angle glaucoma; EBMD epithelial/anterior basement membrane dystrophy; ACIOL anterior chamber intraocular lens; IOL intraocular lens; PCIOL posterior chamber intraocular lens; Phaco/IOL phacoemulsification with intraocular lens placement; PRK photorefractive keratectomy; LASIK laser assisted in situ keratomileusis; HTN hypertension; DM diabetes mellitus; COPD chronic obstructive pulmonary disease

## 2019-02-02 ENCOUNTER — Encounter (INDEPENDENT_AMBULATORY_CARE_PROVIDER_SITE_OTHER): Payer: Self-pay | Admitting: Ophthalmology

## 2019-02-02 ENCOUNTER — Ambulatory Visit (INDEPENDENT_AMBULATORY_CARE_PROVIDER_SITE_OTHER): Payer: Medicare Other | Admitting: Ophthalmology

## 2019-02-02 ENCOUNTER — Other Ambulatory Visit: Payer: Self-pay

## 2019-02-02 DIAGNOSIS — H35033 Hypertensive retinopathy, bilateral: Secondary | ICD-10-CM | POA: Diagnosis not present

## 2019-02-02 DIAGNOSIS — I10 Essential (primary) hypertension: Secondary | ICD-10-CM | POA: Diagnosis not present

## 2019-02-02 DIAGNOSIS — H40053 Ocular hypertension, bilateral: Secondary | ICD-10-CM

## 2019-02-02 DIAGNOSIS — H25813 Combined forms of age-related cataract, bilateral: Secondary | ICD-10-CM

## 2019-02-02 DIAGNOSIS — H353132 Nonexudative age-related macular degeneration, bilateral, intermediate dry stage: Secondary | ICD-10-CM

## 2019-02-02 DIAGNOSIS — H3581 Retinal edema: Secondary | ICD-10-CM | POA: Diagnosis not present

## 2019-04-05 ENCOUNTER — Ambulatory Visit: Admit: 2019-04-05 | Payer: Medicare Other | Admitting: Orthopedic Surgery

## 2019-04-05 SURGERY — ARTHROPLASTY, KNEE, TOTAL
Anesthesia: Choice | Site: Knee | Laterality: Right

## 2019-04-18 ENCOUNTER — Ambulatory Visit: Payer: Medicare Other

## 2019-04-26 ENCOUNTER — Emergency Department (HOSPITAL_COMMUNITY): Payer: Medicare PPO

## 2019-04-26 ENCOUNTER — Inpatient Hospital Stay (HOSPITAL_COMMUNITY)
Admission: EM | Admit: 2019-04-26 | Discharge: 2019-04-28 | DRG: 871 | Disposition: A | Discharge status: Home Health | Payer: Medicare PPO | Attending: Internal Medicine | Admitting: Internal Medicine

## 2019-04-26 ENCOUNTER — Other Ambulatory Visit: Payer: Self-pay

## 2019-04-26 DIAGNOSIS — R6520 Severe sepsis without septic shock: Secondary | ICD-10-CM | POA: Diagnosis present

## 2019-04-26 DIAGNOSIS — A4151 Sepsis due to Escherichia coli [E. coli]: Secondary | ICD-10-CM | POA: Diagnosis not present

## 2019-04-26 DIAGNOSIS — Z8249 Family history of ischemic heart disease and other diseases of the circulatory system: Secondary | ICD-10-CM

## 2019-04-26 DIAGNOSIS — I252 Old myocardial infarction: Secondary | ICD-10-CM

## 2019-04-26 DIAGNOSIS — E039 Hypothyroidism, unspecified: Secondary | ICD-10-CM | POA: Diagnosis present

## 2019-04-26 DIAGNOSIS — N390 Urinary tract infection, site not specified: Secondary | ICD-10-CM | POA: Diagnosis not present

## 2019-04-26 DIAGNOSIS — Z8042 Family history of malignant neoplasm of prostate: Secondary | ICD-10-CM | POA: Diagnosis not present

## 2019-04-26 DIAGNOSIS — G9341 Metabolic encephalopathy: Secondary | ICD-10-CM | POA: Diagnosis present

## 2019-04-26 DIAGNOSIS — Z20822 Contact with and (suspected) exposure to covid-19: Secondary | ICD-10-CM | POA: Diagnosis present

## 2019-04-26 DIAGNOSIS — Z1612 Extended spectrum beta lactamase (ESBL) resistance: Secondary | ICD-10-CM | POA: Diagnosis present

## 2019-04-26 DIAGNOSIS — Z96651 Presence of right artificial knee joint: Secondary | ICD-10-CM | POA: Diagnosis present

## 2019-04-26 DIAGNOSIS — B1920 Unspecified viral hepatitis C without hepatic coma: Secondary | ICD-10-CM | POA: Diagnosis present

## 2019-04-26 DIAGNOSIS — D638 Anemia in other chronic diseases classified elsewhere: Secondary | ICD-10-CM | POA: Diagnosis present

## 2019-04-26 DIAGNOSIS — M5416 Radiculopathy, lumbar region: Secondary | ICD-10-CM

## 2019-04-26 DIAGNOSIS — H3530 Unspecified macular degeneration: Secondary | ICD-10-CM | POA: Diagnosis present

## 2019-04-26 DIAGNOSIS — N3001 Acute cystitis with hematuria: Secondary | ICD-10-CM | POA: Diagnosis present

## 2019-04-26 DIAGNOSIS — Z90710 Acquired absence of both cervix and uterus: Secondary | ICD-10-CM

## 2019-04-26 DIAGNOSIS — E860 Dehydration: Secondary | ICD-10-CM | POA: Diagnosis present

## 2019-04-26 DIAGNOSIS — Z79890 Hormone replacement therapy (postmenopausal): Secondary | ICD-10-CM

## 2019-04-26 DIAGNOSIS — A419 Sepsis, unspecified organism: Secondary | ICD-10-CM

## 2019-04-26 DIAGNOSIS — I10 Essential (primary) hypertension: Secondary | ICD-10-CM | POA: Diagnosis present

## 2019-04-26 DIAGNOSIS — R509 Fever, unspecified: Secondary | ICD-10-CM

## 2019-04-26 DIAGNOSIS — A498 Other bacterial infections of unspecified site: Secondary | ICD-10-CM | POA: Diagnosis present

## 2019-04-26 DIAGNOSIS — Z8349 Family history of other endocrine, nutritional and metabolic diseases: Secondary | ICD-10-CM

## 2019-04-26 DIAGNOSIS — K839 Disease of biliary tract, unspecified: Secondary | ICD-10-CM

## 2019-04-26 DIAGNOSIS — Z79899 Other long term (current) drug therapy: Secondary | ICD-10-CM

## 2019-04-26 DIAGNOSIS — H409 Unspecified glaucoma: Secondary | ICD-10-CM | POA: Diagnosis present

## 2019-04-26 DIAGNOSIS — E785 Hyperlipidemia, unspecified: Secondary | ICD-10-CM | POA: Diagnosis present

## 2019-04-26 LAB — CBC WITH DIFFERENTIAL/PLATELET
Abs Immature Granulocytes: 0.08 10*3/uL — ABNORMAL HIGH (ref 0.00–0.07)
Basophils Absolute: 0 10*3/uL (ref 0.0–0.1)
Basophils Relative: 0 %
Eosinophils Absolute: 0.2 10*3/uL (ref 0.0–0.5)
Eosinophils Relative: 2 %
HCT: 32.8 % — ABNORMAL LOW (ref 36.0–46.0)
Hemoglobin: 10.6 g/dL — ABNORMAL LOW (ref 12.0–15.0)
Immature Granulocytes: 1 %
Lymphocytes Relative: 7 %
Lymphs Abs: 0.7 10*3/uL (ref 0.7–4.0)
MCH: 24.9 pg — ABNORMAL LOW (ref 26.0–34.0)
MCHC: 32.3 g/dL (ref 30.0–36.0)
MCV: 77.2 fL — ABNORMAL LOW (ref 80.0–100.0)
Monocytes Absolute: 0.8 10*3/uL (ref 0.1–1.0)
Monocytes Relative: 9 %
Neutro Abs: 7.8 10*3/uL — ABNORMAL HIGH (ref 1.7–7.7)
Neutrophils Relative %: 81 %
Platelets: 393 10*3/uL (ref 150–400)
RBC: 4.25 MIL/uL (ref 3.87–5.11)
RDW: 17.3 % — ABNORMAL HIGH (ref 11.5–15.5)
WBC: 9.5 10*3/uL (ref 4.0–10.5)
nRBC: 0 % (ref 0.0–0.2)

## 2019-04-26 LAB — COMPREHENSIVE METABOLIC PANEL
ALT: 48 U/L — ABNORMAL HIGH (ref 0–44)
AST: 43 U/L — ABNORMAL HIGH (ref 15–41)
Albumin: 2.3 g/dL — ABNORMAL LOW (ref 3.5–5.0)
Alkaline Phosphatase: 217 U/L — ABNORMAL HIGH (ref 38–126)
Anion gap: 15 (ref 5–15)
BUN: 33 mg/dL — ABNORMAL HIGH (ref 8–23)
CO2: 23 mmol/L (ref 22–32)
Calcium: 8.6 mg/dL — ABNORMAL LOW (ref 8.9–10.3)
Chloride: 96 mmol/L — ABNORMAL LOW (ref 98–111)
Creatinine, Ser: 1.07 mg/dL — ABNORMAL HIGH (ref 0.44–1.00)
GFR calc Af Amer: 59 mL/min — ABNORMAL LOW (ref >60)
GFR calc non Af Amer: 51 mL/min — ABNORMAL LOW (ref >60)
Glucose, Bld: 118 mg/dL — ABNORMAL HIGH (ref 70–99)
Potassium: 4 mmol/L (ref 3.5–5.1)
Sodium: 134 mmol/L — ABNORMAL LOW (ref 135–145)
Total Bilirubin: 0.9 mg/dL (ref 0.3–1.2)
Total Protein: 6.4 g/dL — ABNORMAL LOW (ref 6.5–8.1)

## 2019-04-26 LAB — PROTIME-INR
INR: 1 (ref 0.8–1.2)
Prothrombin Time: 13.4 seconds (ref 11.4–15.2)

## 2019-04-26 LAB — RESPIRATORY PANEL BY RT PCR (FLU A&B, COVID)
Influenza A by PCR: NEGATIVE
Influenza B by PCR: NEGATIVE
SARS Coronavirus 2 by RT PCR: NEGATIVE

## 2019-04-26 LAB — URINALYSIS, ROUTINE W REFLEX MICROSCOPIC
Bilirubin Urine: NEGATIVE
Glucose, UA: NEGATIVE mg/dL
Ketones, ur: NEGATIVE mg/dL
Nitrite: NEGATIVE
Protein, ur: 30 mg/dL — AB
Specific Gravity, Urine: 1.012 (ref 1.005–1.030)
WBC, UA: >50 WBC/hpf — ABNORMAL HIGH (ref 0–5)
pH: 5 (ref 5.0–8.0)

## 2019-04-26 LAB — LACTIC ACID, PLASMA: Lactic Acid, Venous: 1 mmol/L (ref 0.5–1.9)

## 2019-04-26 LAB — PROCALCITONIN: Procalcitonin: 3.18 ng/mL

## 2019-04-26 LAB — APTT: aPTT: 31 seconds (ref 24–36)

## 2019-04-26 MED ORDER — SODIUM CHLORIDE 0.9 % IV BOLUS (SEPSIS)
250.0000 mL | Freq: Once | INTRAVENOUS | Status: AC
  Administered 2019-04-26: 22:00:00 250 mL via INTRAVENOUS

## 2019-04-26 MED ORDER — ACETAMINOPHEN 325 MG PO TABS
650.0000 mg | ORAL_TABLET | Freq: Four times a day (QID) | ORAL | Status: DC | PRN
  Administered 2019-04-26 – 2019-04-28 (×3): 650 mg via ORAL
  Filled 2019-04-26 (×3): qty 2

## 2019-04-26 MED ORDER — SODIUM CHLORIDE 0.9 % IV BOLUS (SEPSIS)
1000.0000 mL | Freq: Once | INTRAVENOUS | Status: AC
  Administered 2019-04-26: 1000 mL via INTRAVENOUS

## 2019-04-26 MED ORDER — VANCOMYCIN HCL 1250 MG/250ML IV SOLN
1250.0000 mg | Freq: Once | INTRAVENOUS | Status: AC
  Administered 2019-04-26: 1250 mg via INTRAVENOUS
  Filled 2019-04-26: qty 250

## 2019-04-26 MED ORDER — SODIUM CHLORIDE 0.9 % IV BOLUS (SEPSIS)
1000.0000 mL | Freq: Once | INTRAVENOUS | Status: AC
  Administered 2019-04-26: 19:00:00 1000 mL via INTRAVENOUS

## 2019-04-26 MED ORDER — SODIUM CHLORIDE 0.9 % IV SOLN
2.0000 g | Freq: Once | INTRAVENOUS | Status: AC
  Administered 2019-04-26: 19:00:00 2 g via INTRAVENOUS
  Filled 2019-04-26: qty 20

## 2019-04-26 MED ORDER — VANCOMYCIN HCL IN DEXTROSE 1-5 GM/200ML-% IV SOLN: 1000.0000 mg | INTRAVENOUS | Status: DC

## 2019-04-26 NOTE — ED Notes (Signed)
Michelle Bruce [phone] HUSBAND FOR AN UPDATE

## 2019-04-26 NOTE — ED Provider Notes (Signed)
[facility] EMERGENCY DEPARTMENT Provider Note   CSN: [id number] Arrival date & time: 04/26/19  1720     History Chief Complaint  Patient presents with  . Altered Mental Status    Michelle Bruce is a 74 y.o. female w/ right knee replacement 10 days ago presenting to the ED with fever and confusion.  Reports knee surgery as outpatient with emerg-ortho about 10 days ago.  She says she been doing fairly well at home.  Has been attending physical therapy and has pretty good range of motion in her leg.  However a few days ago she began having fevers at home.  Her husband is concerned that she was confused today.  Sent her to the emergency department.  She denies any headache, neck stiffness, photophobia.  She denies any nausea vomiting or diarrhea.  She denies any abdominal pain.  She denies any abdominal surgical history.  She denies any shortness of breath, coughing or congestion.  She reports the pain in her right knee has been "okay" and not getting worse.  She reports possible dysuria with frequency a few nights ago, but hasn't noted anything otherwise.  HPI     Past Medical History:  Diagnosis Date  . Cataract   . DDD (degenerative disc disease), lumbar   . Glaucoma   . Hyperlipemia   . Hypertension   . Hypothyroidism   . Macular degeneration   . Myocardial infarction [facility])    ? Takotsubo  . Ocular hypertension   . Sciatica of right side without back pain 12/30/2012  . TMJ (dislocation of temporomandibular joint)     Patient Active Problem List   Diagnosis Date Noted  . Sepsis ([place]) 04/26/2019  . Essential hypertension, benign 02/20/2013  . Other and unspecified hyperlipidemia 02/20/2013  . Unspecified hypothyroidism 02/20/2013  . Potassium deficiency 02/20/2013  . Right lumbar radiculopathy 01/01/2013  . Sciatica of right side without back pain 12/30/2012  . Backache 12/05/2012  . Low back pain 10/29/2012  . Bradycardia 09/16/2012  . Microcytic anemia  02/13/2011  . Takotsubo syndrome 01/18/2011  . Chest pain 01/16/2011  . Non-STEMI (non-ST elevated myocardial infarction) ([place]) 01/16/2011  . Hypertension 01/16/2011  . Hypothyroidism 01/16/2011  . Dyslipidemia 01/16/2011  . Ocular hypertension     Past Surgical History:  Procedure Laterality Date  . ABDOMINAL HYSTERECTOMY    . ABDOMINAL SACROCOLPOPEXY    . APOGEE / PERIGEE REPAIR    . BREAST BIOPSY    . DILATION AND CURETTAGE OF UTERUS    . HAND SURGERY Left 12/07/12  . LEFT HEART CATHETERIZATION WITH CORONARY ANGIOGRAM N/A 01/18/2011   Procedure: LEFT HEART CATHETERIZATION WITH CORONARY ANGIOGRAM;  Surgeon: Larey Dresser, MD;  Location: [facility] CATH LAB;  Service: Cardiovascular;  Laterality: N/A;  . TONSILLECTOMY       OB History   No obstetric history on file.     Family History  Problem Relation Age of Onset  . Coronary artery disease Mother 75  . Hyperlipidemia Mother   . Hypertension Mother   . Prostate cancer Father   . Hypertension Father   . Hyperlipidemia Father     Social History   Tobacco Use  . Smoking status: Never Smoker  . Smokeless tobacco: Never Used  Substance Use Topics  . Alcohol use: Yes    Alcohol/week: 1.0 standard drinks    Types: 1 Glasses of wine per week  . Drug use: No    Home Medications Prior to Admission medications  Medication Sig Start Date End Date Taking? Authorizing Provider  acetaminophen (TYLENOL) 500 MG tablet Take 500 mg by mouth every 6 (six) hours as needed for mild pain, moderate pain or fever.   Yes [provider]  calcium citrate-vitamin D (CITRACAL+D) 315-200 MG-UNIT per tablet Take 1-2 tablets by mouth daily.    Yes [provider]  dorzolamide (TRUSOPT) 2 % ophthalmic solution Place 1 drop into both eyes 2 (two) times daily.   Yes [provider]  estradiol (VIVELLE-DOT) 0.1 MG/24HR Place 1 patch onto the skin 2 (two) times a week. Change on Wednesday and Saturday   Yes [provider]  gabapentin (NEURONTIN) 300 MG capsule Take 300 mg by mouth See admin instructions. 1 tablet by mouth 3 times daily for 2 weeks then 1 tablet by mouth twice daily for 2 weeks then 1 tablet by mouth once daily for 2 weeks.   Yes [provider]  ibuprofen (ADVIL) 200 MG tablet Take 200 mg by mouth every 6 (six) hours as needed for fever or moderate pain.   Yes [provider]  levothyroxine (SYNTHROID, LEVOTHROID) 100 MCG tablet Take 100 mcg by mouth daily before breakfast.   Yes [provider]  liothyronine (CYTOMEL) 5 MCG tablet Take 5 mcg by mouth daily.   Yes [provider]  methocarbamol (ROBAXIN) 500 MG tablet Take 1 tablet by mouth 3 (three) times daily as needed for muscle spasms. 04/14/19  Yes [provider]  ondansetron (ZOFRAN) 4 MG tablet Take 1 tablet by mouth every 6 (six) hours as needed for nausea. 04/14/19  Yes [provider]  oxyCODONE (OXY IR/ROXICODONE) 5 MG immediate release tablet Take 1 tablet by mouth every 4 (four) hours as needed for moderate pain or severe pain.  04/20/19  Yes [provider]  potassium chloride (K-DUR,KLOR-CON) 10 MEQ tablet Take 2 tablets (20 mEq total) by mouth 2 (two) times daily. 06/10/13  Yes Zanard, Bernadene Bell, MD  PRESCRIPTION MEDICATION Take 1 tablet by mouth daily. Pink pill, oval in shape with an L and a 200   Yes [provider]  rosuvastatin (CRESTOR) 20 MG tablet Take 0.5 tablets (10 mg total) by mouth daily. Patient taking differently: Take 20 mg by mouth daily.  06/10/13 04/26/19 Yes Zanard, Bernadene Bell, MD  telmisartan-hydrochlorothiazide (MICARDIS HCT) 80-12.5 MG tablet Take 1 tablet by mouth daily.   Yes [provider]  timolol (TIMOPTIC) 0.5 % ophthalmic solution Place 1 drop into both eyes daily.  03/30/13  Yes [provider]  traMADol (ULTRAM) 50 MG tablet Take 1-2 tablets by mouth every 6 (six) hours as needed for moderate pain. 04/14/19  Yes  [provider]  TRAVATAN Z 0.004 % SOLN ophthalmic solution Place 1 drop into both eyes at bedtime.  04/29/13  Yes [provider]  zolpidem (AMBIEN) 10 MG tablet Take 5 mg by mouth at bedtime.    Yes [provider]  levothyroxine (SYNTHROID, LEVOTHROID) 75 MCG tablet Take 1 tablet (75 mcg total) by mouth daily. Patient not taking: Reported on 04/26/2019 06/10/13 06/11/14  Jonathon Resides, MD  liothyronine (CYTOMEL) 5 MCG tablet Take 1 tablet (5 mcg total) by mouth daily. Patient not taking: Reported on 04/26/2019 06/10/13 06/10/14  Jonathon Resides, MD  losartan (COZAAR) 100 MG tablet Take 1 tablet (100 mg total) by mouth daily. Patient not taking: Reported on 04/26/2019 06/10/13 06/10/14  Jonathon Resides, MD    Allergies    Atenolol  hydrochloride and Vicodin [hydrocodone-acetaminophen]  Review of Systems   Review of Systems  Constitutional: Positive for chills, fatigue and fever.  Eyes: Negative for pain and visual disturbance.  Respiratory: Negative for cough and shortness of breath.   Cardiovascular: Negative for chest pain and palpitations.  Gastrointestinal: Negative for abdominal pain, nausea and vomiting.  Genitourinary: Positive for dysuria and flank pain. Negative for hematuria.  Musculoskeletal: Positive for arthralgias and myalgias. Negative for gait problem.  Skin: Positive for wound. Negative for rash.  Neurological: Negative for syncope and light-headedness.  Psychiatric/Behavioral: Positive for confusion. Negative for agitation.  All other systems reviewed and are negative.   Physical Exam Updated Vital Signs BP (!) 143/80 (BP Location: Right Arm)   Pulse 92   Temp 99.3 F (37.4 C)   Resp 16   Ht 5' 6.5" (1.689 m)   Wt 79.1 kg   SpO2 98%   BMI 27.72 kg/m   Physical Exam Vitals and nursing note reviewed.  Constitutional:      General: She is not in acute distress.    Appearance: She is well-developed.  HENT:     Head: Normocephalic and  atraumatic.  Eyes:     Conjunctiva/sclera: Conjunctivae normal.  Cardiovascular:     Rate and Rhythm: Normal rate and regular rhythm.     Pulses: Normal pulses.  Pulmonary:     Effort: Pulmonary effort is normal. No respiratory distress.     Comments: No hypoxia Abdominal:     General: There is no distension.     Palpations: Abdomen is soft.     Tenderness: There is no abdominal tenderness. There is no guarding or rebound. Negative signs include Murphy's sign and McBurney's sign.  Musculoskeletal:     Cervical back: Neck supple.     Comments: Post-operative incision site depicted below, mild warmth, no significant erythema, minimal tenderness with palpation.  Patient able to actively flex and extend right leg fully without significant discomfort  Skin:    General: Skin is warm and dry.  Neurological:     General: No focal deficit present.     Mental Status: She is alert and oriented to person, place, and time.     Cranial Nerves: No cranial nerve deficit.     Sensory: No sensory deficit.     Comments: Occasionally having difficulty remembering details       ED Results / Procedures / Treatments   Labs (all labs ordered are listed, but only abnormal results are displayed) Labs Reviewed  COMPREHENSIVE METABOLIC PANEL - Abnormal; Notable for the following components:      Result Value   Sodium 134 (*)    Chloride 96 (*)    Glucose, Bld 118 (*)    BUN 33 (*)    Creatinine, Ser 1.07 (*)    Calcium 8.6 (*)    Total Protein 6.4 (*)    Albumin 2.3 (*)    AST 43 (*)    ALT 48 (*)    Alkaline Phosphatase 217 (*)    GFR calc non Af Amer 51 (*)    GFR calc Af Amer 59 (*)    All other components within normal limits  CBC WITH DIFFERENTIAL/PLATELET - Abnormal; Notable for the following components:   Hemoglobin 10.6 (*)    HCT 32.8 (*)    MCV 77.2 (*)    MCH 24.9 (*)    RDW 17.3 (*)    Neutro Abs 7.8 (*)    Abs Immature Granulocytes 0.08 (*)  All other components within  normal limits  URINALYSIS, ROUTINE W REFLEX MICROSCOPIC - Abnormal; Notable for the following components:   APPearance HAZY (*)    Hgb urine dipstick MODERATE (*)    Protein, ur 30 (*)    Leukocytes,Ua LARGE (*)    WBC, UA >50 (*)    Bacteria, UA MANY (*)    All other components within normal limits  COMPREHENSIVE METABOLIC PANEL - Abnormal; Notable for the following components:   Sodium 133 (*)    Potassium 3.4 (*)    CO2 18 (*)    Glucose, Bld 163 (*)    BUN 27 (*)    Creatinine, Ser 1.09 (*)    Calcium 7.9 (*)    Total Protein 5.6 (*)    Albumin 2.0 (*)    Alkaline Phosphatase 203 (*)    GFR calc non Af Amer 50 (*)    GFR calc Af Amer 58 (*)    All other components within normal limits  CBC - Abnormal; Notable for the following components:   Hemoglobin 9.7 (*)    HCT 30.2 (*)    MCV 76.1 (*)    MCH 24.4 (*)    RDW 17.1 (*)    All other components within normal limits  HEPATITIS PANEL, ACUTE - Abnormal; Notable for the following components:   HCV Ab Reactive (*)    All other components within normal limits  CULTURE, BLOOD (ROUTINE X 2)  CULTURE, BLOOD (ROUTINE X 2)  RESPIRATORY PANEL BY RT PCR (FLU A&B, COVID)  URINE CULTURE  LACTIC ACID, PLASMA  APTT  PROTIME-INR  PROCALCITONIN  VITAMIN B12  TSH  VITAMIN D 25 HYDROXY (VIT D DEFICIENCY, FRACTURES)  HCV RNA QUANT    EKG EKG Interpretation  Date/Time:  Monday April 26 2019 19:02:42 EST Ventricular Rate:  75 PR Interval:    QRS Duration: 101 QT Interval:  410 QTC Calculation: 458 R Axis:   14 Text Interpretation: Sinus rhythm Low voltage, precordial leads Borderline T abnormalities, anterior leads No STEMI Confirmed by Octaviano Glow [phone]) on 04/26/2019 7:16:03 PM   Radiology DG Chest Port 1 View  Result Date: 04/26/2019 CLINICAL DATA:  Sepsis, confusion, fever EXAM: PORTABLE CHEST 1 VIEW COMPARISON:  11/30/2012 FINDINGS: The heart size and mediastinal contours are within normal limits. Both lungs  are clear. The visualized skeletal structures are unremarkable. IMPRESSION: No active disease. Electronically Signed   By: Randa Ngo M.D.   On: 04/26/2019 18:30   US Abdomen Limited RUQ  Result Date: 04/26/2019 CLINICAL DATA:  Possible biliary disease, sepsis confusion EXAM: ULTRASOUND ABDOMEN LIMITED RIGHT UPPER QUADRANT COMPARISON:  None. CT 01/16/2011 FINDINGS: Gallbladder: No shadowing stone. Borderline increased wall thickness. No sonographic Percell Miller indicated Common bile duct: Diameter: 2.6 mm Liver: Liver may be slightly echogenic. No focal hepatic abnormality portal vein is patent on color Doppler imaging with normal direction of blood flow towards the liver. Other: None. IMPRESSION: 1. Negative for gallstones or biliary dilatation 2. Possible slightly echogenic liver as may be seen with mild steatosis Electronically Signed   By: Donavan Foil M.D.   On: 04/26/2019 22:02    Procedures .Critical Care Performed by: Wyvonnia Dusky, MD Authorized by: Wyvonnia Dusky, MD   Critical care provider statement:    Critical care time (minutes):  42   Critical care was necessary to treat or prevent imminent or life-threatening deterioration of the following conditions:  Sepsis   Critical care was time spent personally by me  on the following activities:  Discussions with consultants, evaluation of patient's response to treatment, examination of patient, ordering and performing treatments and interventions, ordering and review of laboratory studies, ordering and review of radiographic studies, pulse oximetry, re-evaluation of patient's condition, obtaining history from patient or surrogate and review of old charts Comments:     IV fluids and IV antibiotics for sepsis   (including critical care time)  Medications Ordered in ED Medications  enoxaparin (LOVENOX) injection 40 mg (40 mg Subcutaneous Given 04/27/19 0955)  acetaminophen (TYLENOL) tablet 650 mg (650 mg Oral Given 04/26/19 2312)   oxyCODONE (Oxy IR/ROXICODONE) immediate release tablet 5 mg (5 mg Oral Given 04/27/19 0255)  rosuvastatin (CRESTOR) tablet 20 mg (20 mg Oral Given 04/27/19 0955)  levothyroxine (SYNTHROID) tablet 100 mcg (100 mcg Oral Given 04/27/19 0831)  liothyronine (CYTOMEL) tablet 5 mcg (5 mcg Oral Given 04/27/19 0957)  ondansetron (ZOFRAN) tablet 4 mg (has no administration in time range)  dorzolamide (TRUSOPT) 2 % ophthalmic solution 1 drop (1 drop Both Eyes Given 04/27/19 0957)  timolol (TIMOPTIC) 0.5 % ophthalmic solution 1 drop (1 drop Both Eyes Given 04/27/19 1128)  latanoprost (XALATAN) 0.005 % ophthalmic solution 1 drop (1 drop Both Eyes Given 04/27/19 0255)  cefTRIAXone (ROCEPHIN) 2 g in sodium chloride 0.9 % 100 mL IVPB (has no administration in time range)  irbesartan (AVAPRO) tablet 300 mg (300 mg Oral Given 04/27/19 0955)    And  hydrochlorothiazide (MICROZIDE) capsule 12.5 mg (12.5 mg Oral Given 04/27/19 0955)  0.9 %  sodium chloride infusion ( Intravenous New Bag/Given 04/27/19 1125)  sodium chloride 0.9 % bolus 1,000 mL (0 mLs Intravenous Stopped 04/26/19 2113)    And  sodium chloride 0.9 % bolus 1,000 mL (0 mLs Intravenous Stopped 04/26/19 2113)    And  sodium chloride 0.9 % bolus 250 mL (0 mLs Intravenous Stopped 04/26/19 2231)  cefTRIAXone (ROCEPHIN) 2 g in sodium chloride 0.9 % 100 mL IVPB (0 g Intravenous Stopped 04/26/19 2113)  vancomycin (VANCOREADY) IVPB 1250 mg/250 mL (0 mg Intravenous Stopped 04/26/19 2113)    ED Course  I have reviewed the triage vital signs and the nursing notes.  Pertinent labs & imaging results that were available during my care of the patient were reviewed by me and considered in my medical decision making (see chart for details).  74 yo female presenting with fever of unknown origin for several days, also reportedly confused off her baseline at home per her husband's report.  She is febrile here, but otherwise hemodynamically stable.  Overall she is well  appearing.  Infectious workup was notable for UA strongly suggestive of cystitis - likely source of her fever at this point.  Otherwise she has a negative COVID-19, unremarkable chest xray, no signs of meningitis or intracranial infection.  I have a low suspicion for a septic joint given her minimal pain and good ROM of that joint, but I discussed this with ortho as noted below, and they are in agreement this is not likely related to a joint infection.  The patient's labs showed some mild liver dysfunction and alk phos elevation.  She had no acute GI symptoms, and negative murphy's sign, but a RUQ ultrasound was obtained to evaluate GB, which did not show this as a likely cause of her fever.     Clinical Course as of Apr 26 1136  Mon Apr 26, 2019  1916 Ortho paged   [MT]  1919 RUQ ultrasound ordered to evaluate elevated alk phos,  no clear source of fever otherwise at this time. This may be all viral illness (covid) causing these lab descrepancies and some dehydraiton, but it's too early in her workup to tell this.  Less likely hepatitis with no significant transaminitis or GI symptoms.   [MT]  2054 I spoke to Dr Alvan Dame of orthopedics and reviewed the case and photo of patient's operation site, which he states looks normal for 10 days post-operative.  Given her clinical picture he does not believe this is a septic joint.  He recommended infectious/fever workup for other causes, consider medical admission if unclear   [MT]  2151 Leukocytes,Ua(!): LARGE [MT]  2151 WBC, UA(!): >50 [MT]  2151 Bacteria, UA(!): MANY [MT]  2152 Likely UTI, pt received Ceftriaxone, with this source we'll admit   [MT]  2207 Signout given to hospitalist   [MT]    Clinical Course User Index [MT] Wyvonnia Dusky, MD    Final Clinical Impression(s) / ED Diagnoses Final diagnoses:  Fever  Biliary disease  Acute cystitis with hematuria  Sepsis, due to unspecified organism, unspecified whether acute organ  dysfunction present [facility])    Rx / DC Orders ED Discharge Orders    None       Wyvonnia Dusky, MD 04/27/19 1138

## 2019-04-26 NOTE — ED Notes (Signed)
Pt transported to US

## 2019-04-26 NOTE — ED Triage Notes (Signed)
Pt here with [facility] from home. Pt had total knee replacement 10 days ago. Saturday pt spiked a  Fever with confusion, husband gave 500 of tylenol and fever and confusion resolved. Pt spiked a fever at home abt 1500 and was confused. husband gave 500 of tylenol abt 1515. Pt had hydrocodone at 1200. Denies any weakness, slurred speech. Slight redness around incision and warm to touch.

## 2019-04-26 NOTE — ED Notes (Signed)
Pt stating she doesn't know what the status of her care is. This RN explained to the pt again that we need a urine sample and she is waiting on an ultrasound to be preformed.

## 2019-04-26 NOTE — ED Notes (Signed)
Pt is noted to be becoming more alter than she was when RN previously rounded on the pt at 22:30. Pt is shivering and has a low grade fever of 100.5. Pt is alerts to self and location. MD made aware, orders to be placed.

## 2019-04-26 NOTE — H&P (Signed)
[facility] History and Physical  Michelle Bruce NWG:[id number] DOB: Oct 05, 1945 DOA: 04/26/2019  Referring physician: Renaye Rakers, MD PCP: [name], MD   Chief Complaint: confusion  HPI: Michelle Bruce is a 74 y.o. female with hx of HTN, hypothyroidism, recent R knee replacement about ten days ago, who presents for confusion.  History primarily via ED provider as patient unable to engage significantly in interview due to confusion. Per signout husband brought patient to ED today due to fevers and confusion at home but otherwise has been doing well since her knee surgery.   Per ED provider was seen by Ortho with low suspicion for septic joint. Eval notable for fever to 102.4 F but otherwise unremarkable vitals, neg COVID test, normal procal, CMP w mildly elevated Cr 1.07 and AST 43/ALT 48, normal lactice acid, normal white count, UA with some leuks. Unremarkable CXR and RUQ [place]. She was given 3L IVF and started on Ceftriaxone/Vanc for possible urinary source.   On my interview patient is arousable and oriented to person and time but not to place. She denies feeling confused at present. Endorses nausea, but denies burning or pain with urination, chest pain, SOB, cough, back pain. Also denies alcohol intake.   Review of Systems:  Full ROS not completed with patient due to patient condition: delirium   Past Medical History:  Diagnosis Date  . Cataract   . DDD (degenerative disc disease), lumbar   . Glaucoma   . Hyperlipemia   . Hypertension   . Hypothyroidism   . Macular degeneration   . Myocardial infarction [facility])    ? Takotsubo  . Ocular hypertension   . Sciatica of right side without back pain 12/30/2012  . TMJ (dislocation of temporomandibular joint)    Past Surgical History:  Procedure Laterality Date  . ABDOMINAL HYSTERECTOMY    . ABDOMINAL SACROCOLPOPEXY    . APOGEE / PERIGEE REPAIR    . BREAST BIOPSY    . DILATION AND CURETTAGE OF UTERUS    . HAND SURGERY Left  12/07/12  . LEFT HEART CATHETERIZATION WITH CORONARY ANGIOGRAM N/A 01/18/2011   Procedure: LEFT HEART CATHETERIZATION WITH CORONARY ANGIOGRAM;  Surgeon: Laurey Morale, MD;  Location: [facility] CATH LAB;  Service: Cardiovascular;  Laterality: N/A;  . TONSILLECTOMY     Social History:  reports that she has never smoked. She has never used smokeless tobacco. She reports current alcohol use of about 1.0 standard drinks of alcohol per week. She reports that she does not use drugs.  Allergies  Allergen Reactions  . Atenolol Hydrochloride Cough  . Vicodin [Hydrocodone-Acetaminophen]     Hyper     Family History  Problem Relation Age of Onset  . Coronary artery disease Mother 4  . Hyperlipidemia Mother   . Hypertension Mother   . Prostate cancer Father   . Hypertension Father   . Hyperlipidemia Father     Prior to Admission medications   Medication Sig Start Date End Date Taking? Authorizing Provider  acetaminophen (TYLENOL) 500 MG tablet Take 500 mg by mouth every 6 (six) hours as needed for mild pain, moderate pain or fever.   Yes [provider]  calcium citrate-vitamin D (CITRACAL+D) 315-200 MG-UNIT per tablet Take 1-2 tablets by mouth daily.    Yes [provider]  dorzolamide (TRUSOPT) 2 % ophthalmic solution Place 1 drop into both eyes 2 (two) times daily.   Yes [provider]  estradiol (VIVELLE-DOT) 0.1 MG/24HR Place 1 patch onto the skin  2 (two) times a week. Change on Wednesday and Saturday   Yes [provider]  gabapentin (NEURONTIN) 300 MG capsule Take 300 mg by mouth See admin instructions. 1 tablet by mouth 3 times daily for 2 weeks then 1 tablet by mouth twice daily for 2 weeks then 1 tablet by mouth once daily for 2 weeks.   Yes [provider]  ibuprofen (ADVIL) 200 MG tablet Take 200 mg by mouth every 6 (six) hours as needed for fever or moderate pain.   Yes [provider]  levothyroxine (SYNTHROID, LEVOTHROID) 100 MCG  tablet Take 100 mcg by mouth daily before breakfast.   Yes [provider]  liothyronine (CYTOMEL) 5 MCG tablet Take 5 mcg by mouth daily.   Yes [provider]  methocarbamol (ROBAXIN) 500 MG tablet Take 1 tablet by mouth 3 (three) times daily as needed for muscle spasms. 04/14/19  Yes [provider]  ondansetron (ZOFRAN) 4 MG tablet Take 1 tablet by mouth every 6 (six) hours as needed for nausea. 04/14/19  Yes [provider]  oxyCODONE (OXY IR/ROXICODONE) 5 MG immediate release tablet Take 1 tablet by mouth every 4 (four) hours as needed for moderate pain or severe pain.  04/20/19  Yes [provider]  potassium chloride (K-DUR,KLOR-CON) 10 MEQ tablet Take 2 tablets (20 mEq total) by mouth 2 (two) times daily. 06/10/13  Yes Zanard, Hinton Dyer, MD  PRESCRIPTION MEDICATION Take 1 tablet by mouth daily. Pink pill, oval in shape with an L and a 200   Yes [provider]  rosuvastatin (CRESTOR) 20 MG tablet Take 0.5 tablets (10 mg total) by mouth daily. Patient taking differently: Take 20 mg by mouth daily.  06/10/13 04/26/19 Yes Zanard, Hinton Dyer, MD  telmisartan-hydrochlorothiazide (MICARDIS HCT) 80-12.5 MG tablet Take 1 tablet by mouth daily.   Yes [provider]  timolol (TIMOPTIC) 0.5 % ophthalmic solution Place 1 drop into both eyes daily.  03/30/13  Yes [provider]  traMADol (ULTRAM) 50 MG tablet Take 1-2 tablets by mouth every 6 (six) hours as needed for moderate pain. 04/14/19  Yes [provider]  TRAVATAN Z 0.004 % SOLN ophthalmic solution Place 1 drop into both eyes at bedtime.  04/29/13  Yes [provider]  zolpidem (AMBIEN) 10 MG tablet Take 5 mg by mouth at bedtime.    Yes [provider]  levothyroxine (SYNTHROID, LEVOTHROID) 75 MCG tablet Take 1 tablet (75 mcg total) by mouth daily. Patient not taking: Reported on 04/26/2019 06/10/13 06/11/14  [name], MD  liothyronine (CYTOMEL) 5 MCG tablet Take 1  tablet (5 mcg total) by mouth daily. Patient not taking: Reported on 04/26/2019 06/10/13 06/10/14  [name], MD  losartan (COZAAR) 100 MG tablet Take 1 tablet (100 mg total) by mouth daily. Patient not taking: Reported on 04/26/2019 06/10/13 06/10/14  [name], MD   Physical Exam: Vitals:   04/26/19 2301 04/26/19 2330 04/27/19 0000 04/27/19 0042  BP:  (!) 153/67 (!) 143/74 (!) 141/69  Pulse:  94 99 98  Resp:   12 16  Temp: (!) 100.5 F (38.1 C)   (!) 100.9 F (38.3 C)  TempSrc: Oral   Oral  SpO2:  98% 95% 97%    Wt Readings from Last 3 Encounters:  10/12/13 72.6 kg  06/10/13 69.4 kg  05/12/13 69.9 kg    General:  Appears calm and comfortable Eyes: PERRL, normal lids, irises & conjunctiva ENT: grossly normal hearing,  lips & tongue, MMM Neck: no LAD, masses or thyromegaly Cardiovascular: RRR, no m/r/g. No LE edema. Telemetry: SR, no arrhythmias  Respiratory: CTA bilaterally, no w/r/r. Normal respiratory effort. Abdomen: soft, mild suprapubic tenderness Skin: no rash or induration seen on limited exam Musculoskeletal: grossly normal tone BUE/BLE. R knee w surgical scar, warm to touch but not erythrematous and does not appear swollen compared to L knee, able to flex knee with assistance to about 40 degrees Psychiatric: grossly normal mood and affect, speech fluent and appropriate Neurologic: grossly non-focal.          Labs on Admission:  Basic Metabolic Panel: Recent Labs  Lab 04/26/19 1816  NA 134*  K 4.0  CL 96*  CO2 23  GLUCOSE 118*  BUN 33*  CREATININE 1.07*  CALCIUM 8.6*   Liver Function Tests: Recent Labs  Lab 04/26/19 1816  AST 43*  ALT 48*  ALKPHOS 217*  BILITOT 0.9  PROT 6.4*  ALBUMIN 2.3*   No results for input(s): LIPASE, AMYLASE in the last 168 hours. No results for input(s): AMMONIA in the last 168 hours. CBC: Recent Labs  Lab 04/26/19 1816  WBC 9.5  NEUTROABS 7.8*  HGB 10.6*  HCT 32.8*  MCV 77.2*  PLT 393   Cardiac  Enzymes: No results for input(s): CKTOTAL, CKMB, CKMBINDEX, TROPONINI in the last 168 hours.  BNP (last 3 results) No results for input(s): BNP in the last 8760 hours.  ProBNP (last 3 results) No results for input(s): PROBNP in the last 8760 hours.  CBG: No results for input(s): GLUCAP in the last 168 hours.  Radiological Exams on Admission: DG Chest Port 1 View  Result Date: 04/26/2019 CLINICAL DATA:  Sepsis, confusion, fever EXAM: PORTABLE CHEST 1 VIEW COMPARISON:  11/30/2012 FINDINGS: The heart size and mediastinal contours are within normal limits. Both lungs are clear. The visualized skeletal structures are unremarkable. IMPRESSION: No active disease. Electronically Signed   By: Randa Ngo M.D.   On: 04/26/2019 18:30   US Abdomen Limited RUQ  Result Date: 04/26/2019 CLINICAL DATA:  Possible biliary disease, sepsis confusion EXAM: ULTRASOUND ABDOMEN LIMITED RIGHT UPPER QUADRANT COMPARISON:  None. CT 01/16/2011 FINDINGS: Gallbladder: No shadowing stone. Borderline increased wall thickness. No sonographic Percell Miller indicated Common bile duct: Diameter: 2.6 mm Liver: Liver may be slightly echogenic. No focal hepatic abnormality portal vein is patent on color Doppler imaging with normal direction of blood flow towards the liver. Other: None. IMPRESSION: 1. Negative for gallstones or biliary dilatation 2. Possible slightly echogenic liver as may be seen with mild steatosis Electronically Signed   By: Donavan Foil M.D.   On: 04/26/2019 22:02    Assessment/Plan Active Problems:   Hypertension   Hypothyroidism   Dyslipidemia   Sepsis ([place])  #Sepsis Patient presenting with febrile illness and confusion with unclear source. COVID test is negative, CXR unremarkable. R knee s/p replacement recently but on eval by Ortho not suspicious for septic joint and does not appear so on my exam either, however prudent to maintain broad coverage for this possibility until other studies are available.  Some suprapubic tenderness on exam, urinary source possible. Intraabdominal etiology possible given mildly elevated LFT's though abd US unremarkable and acute hepatitis unlikely.  - cont Vanc/Ceftriaxone pending micro studies - follow up urine culture - follow up blood cultures - obtain further collateral from husband in AM - consider knee aspiration if no improvement or negative micro studies  #Elevated LFTs Unclear etiology but only mildly elevated and some ?  steatosis on abd [place]. - acute hepatitis panel  #Known medical problems Hypothyroid - cont synthroid, cytomel HLD - cont rosuvastatin HTN - cont telmisartan-hctz    Code Status: Full code presumed, patient unable to engage in discussion DVT Prophylaxis: Lovenox Family Communication: update husband in AM Disposition Plan: admit to inpaitent (indicate anticipated LOS)  Time spent: 50 min  Venora Maples [facility]

## 2019-04-26 NOTE — Progress Notes (Signed)
Pharmacy Antibiotic Note  Michelle Bruce is a 74 y.o. female admitted on 04/26/2019 with bacteremia and sepsis.  Pharmacy has been consulted for vancomycin dosing.  Patient has just recently had a total knee replacement 10 days ago with complaints of spiking fevers. WBC WNL. Lactate WNL. Procalcitonin 3.18. Tmax 102.4.   Plan: - Vancomycin loading dose 1250 mg IV x1 - Vancomycin maintenance 1000 mg IV q24hr. Scr used 1.07. Est AUC 460. Goal AUC 400-550.  - Ceftriaxone per MD, f/u need for gram negative coverage - Monitor renal function, c/s, vancomycin levels at steady state as indicated     Temp (24hrs), Avg:102.4 F (39.1 C), Min:102.4 F (39.1 C), Max:102.4 F (39.1 C)  No results for input(s): WBC, CREATININE, LATICACIDVEN, VANCOTROUGH, VANCOPEAK, VANCORANDOM, GENTTROUGH, GENTPEAK, GENTRANDOM, TOBRATROUGH, TOBRAPEAK, TOBRARND, AMIKACINPEAK, AMIKACINTROU, AMIKACIN in the last 168 hours.  CrCl cannot be calculated (Patient's most recent lab result is older than the maximum 21 days allowed.).    Allergies  Allergen Reactions  . Atenolol Hydrochloride Cough  . Vicodin [Hydrocodone-Acetaminophen]     Hyper     Antimicrobials this admission: Vancomycin 2/15 >>  CTX 2/15 x1  Dose adjustments this admission:  Microbiology results: 2/15 BCx: sent 2/15 UCx: sent   Thank you for allowing pharmacy to be a part of this patient's care.  Ellison Carwin, PharmD PGY1 Pharmacy Resident

## 2019-04-26 NOTE — ED Notes (Signed)
Pt calling out stating no one has informed her of what is going on. This RN explained that we are waiting on a urine sample and ultra sound. Pt verbalized understanding. Pt requesting to speak to her husband. This RN dialed his number and pt is speaking to him now.

## 2019-04-26 NOTE — ED Notes (Signed)
Spoke to pt husband and gave him an update on pt status and plan of care.

## 2019-04-27 ENCOUNTER — Encounter (HOSPITAL_COMMUNITY): Payer: Self-pay | Admitting: Family Medicine

## 2019-04-27 DIAGNOSIS — N390 Urinary tract infection, site not specified: Secondary | ICD-10-CM

## 2019-04-27 DIAGNOSIS — I10 Essential (primary) hypertension: Secondary | ICD-10-CM

## 2019-04-27 DIAGNOSIS — E039 Hypothyroidism, unspecified: Secondary | ICD-10-CM

## 2019-04-27 DIAGNOSIS — R6520 Severe sepsis without septic shock: Secondary | ICD-10-CM

## 2019-04-27 DIAGNOSIS — A419 Sepsis, unspecified organism: Secondary | ICD-10-CM

## 2019-04-27 DIAGNOSIS — E785 Hyperlipidemia, unspecified: Secondary | ICD-10-CM

## 2019-04-27 LAB — VITAMIN D 25 HYDROXY (VIT D DEFICIENCY, FRACTURES): Vit D, 25-Hydroxy: 34.99 ng/mL (ref 30–100)

## 2019-04-27 LAB — CBC
HCT: 30.2 % — ABNORMAL LOW (ref 36.0–46.0)
Hemoglobin: 9.7 g/dL — ABNORMAL LOW (ref 12.0–15.0)
MCH: 24.4 pg — ABNORMAL LOW (ref 26.0–34.0)
MCHC: 32.1 g/dL (ref 30.0–36.0)
MCV: 76.1 fL — ABNORMAL LOW (ref 80.0–100.0)
Platelets: 379 10*3/uL (ref 150–400)
RBC: 3.97 MIL/uL (ref 3.87–5.11)
RDW: 17.1 % — ABNORMAL HIGH (ref 11.5–15.5)
WBC: 7.9 10*3/uL (ref 4.0–10.5)
nRBC: 0 % (ref 0.0–0.2)

## 2019-04-27 LAB — COMPREHENSIVE METABOLIC PANEL
ALT: 39 U/L (ref 0–44)
AST: 35 U/L (ref 15–41)
Albumin: 2 g/dL — ABNORMAL LOW (ref 3.5–5.0)
Alkaline Phosphatase: 203 U/L — ABNORMAL HIGH (ref 38–126)
Anion gap: 13 (ref 5–15)
BUN: 27 mg/dL — ABNORMAL HIGH (ref 8–23)
CO2: 18 mmol/L — ABNORMAL LOW (ref 22–32)
Calcium: 7.9 mg/dL — ABNORMAL LOW (ref 8.9–10.3)
Chloride: 102 mmol/L (ref 98–111)
Creatinine, Ser: 1.09 mg/dL — ABNORMAL HIGH (ref 0.44–1.00)
GFR calc Af Amer: 58 mL/min — ABNORMAL LOW (ref >60)
GFR calc non Af Amer: 50 mL/min — ABNORMAL LOW (ref >60)
Glucose, Bld: 163 mg/dL — ABNORMAL HIGH (ref 70–99)
Potassium: 3.4 mmol/L — ABNORMAL LOW (ref 3.5–5.1)
Sodium: 133 mmol/L — ABNORMAL LOW (ref 135–145)
Total Bilirubin: 0.9 mg/dL (ref 0.3–1.2)
Total Protein: 5.6 g/dL — ABNORMAL LOW (ref 6.5–8.1)

## 2019-04-27 LAB — HEPATITIS PANEL, ACUTE
HCV Ab: REACTIVE — AB
Hep A IgM: NONREACTIVE
Hep B C IgM: NONREACTIVE
Hepatitis B Surface Ag: NONREACTIVE

## 2019-04-27 LAB — VITAMIN B12: Vitamin B-12: 303 pg/mL (ref 180–914)

## 2019-04-27 LAB — TSH: TSH: 2.768 u[IU]/mL (ref 0.350–4.500)

## 2019-04-27 MED ORDER — OXYCODONE HCL 5 MG PO TABS
5.0000 mg | ORAL_TABLET | ORAL | Status: DC | PRN
  Administered 2019-04-27 – 2019-04-28 (×2): 5 mg via ORAL
  Filled 2019-04-27 (×2): qty 1

## 2019-04-27 MED ORDER — LATANOPROST 0.005 % OP SOLN
1.0000 [drp] | Freq: Every day | OPHTHALMIC | Status: DC
  Administered 2019-04-27 (×2): 1 [drp] via OPHTHALMIC
  Filled 2019-04-27: qty 2.5

## 2019-04-27 MED ORDER — ROSUVASTATIN CALCIUM 20 MG PO TABS
20.0000 mg | ORAL_TABLET | Freq: Every day | ORAL | Status: DC
  Administered 2019-04-27 – 2019-04-28 (×2): 20 mg via ORAL
  Filled 2019-04-27 (×2): qty 1

## 2019-04-27 MED ORDER — ENOXAPARIN SODIUM 40 MG/0.4ML ~~LOC~~ SOLN
40.0000 mg | Freq: Every day | SUBCUTANEOUS | Status: DC
  Administered 2019-04-27 – 2019-04-28 (×2): 40 mg via SUBCUTANEOUS
  Filled 2019-04-27 (×2): qty 0.4

## 2019-04-27 MED ORDER — ONDANSETRON HCL 4 MG PO TABS: 4.0000 mg | ORAL_TABLET | Freq: Four times a day (QID) | ORAL | Status: DC | PRN

## 2019-04-27 MED ORDER — TIMOLOL MALEATE 0.5 % OP SOLN
1.0000 [drp] | Freq: Every day | OPHTHALMIC | Status: DC
  Administered 2019-04-27 – 2019-04-28 (×2): 1 [drp] via OPHTHALMIC
  Filled 2019-04-27 (×2): qty 5

## 2019-04-27 MED ORDER — HYDROCHLOROTHIAZIDE 12.5 MG PO CAPS
12.5000 mg | ORAL_CAPSULE | Freq: Every day | ORAL | Status: DC
  Administered 2019-04-27 – 2019-04-28 (×2): 12.5 mg via ORAL
  Filled 2019-04-27 (×2): qty 1

## 2019-04-27 MED ORDER — IRBESARTAN 300 MG PO TABS
300.0000 mg | ORAL_TABLET | Freq: Every day | ORAL | Status: DC
  Administered 2019-04-27 – 2019-04-28 (×2): 300 mg via ORAL
  Filled 2019-04-27 (×2): qty 1

## 2019-04-27 MED ORDER — LEVOTHYROXINE SODIUM 100 MCG PO TABS
100.0000 ug | ORAL_TABLET | Freq: Every day | ORAL | Status: DC
  Administered 2019-04-27 – 2019-04-28 (×2): 100 ug via ORAL
  Filled 2019-04-27 (×2): qty 1

## 2019-04-27 MED ORDER — TELMISARTAN-HCTZ 80-12.5 MG PO TABS: 1.0000 | ORAL_TABLET | Freq: Every day | ORAL | Status: DC

## 2019-04-27 MED ORDER — LIOTHYRONINE SODIUM 5 MCG PO TABS
5.0000 ug | ORAL_TABLET | Freq: Every day | ORAL | Status: DC
  Administered 2019-04-27 – 2019-04-28 (×2): 5 ug via ORAL
  Filled 2019-04-27 (×2): qty 1

## 2019-04-27 MED ORDER — VANCOMYCIN HCL IN DEXTROSE 1-5 GM/200ML-% IV SOLN: 1000.0000 mg | INTRAVENOUS | Status: DC

## 2019-04-27 MED ORDER — DORZOLAMIDE HCL 2 % OP SOLN
1.0000 [drp] | Freq: Two times a day (BID) | OPHTHALMIC | Status: DC
  Administered 2019-04-27 – 2019-04-28 (×4): 1 [drp] via OPHTHALMIC
  Filled 2019-04-27: qty 10

## 2019-04-27 MED ORDER — SODIUM CHLORIDE 0.9 % IV SOLN: INTRAVENOUS | Status: AC

## 2019-04-27 MED ORDER — SODIUM CHLORIDE 0.9 % IV SOLN
2.0000 g | INTRAVENOUS | Status: DC
  Administered 2019-04-27: 17:00:00 2 g via INTRAVENOUS
  Filled 2019-04-27: qty 20
  Filled 2019-04-27: qty 2

## 2019-04-27 NOTE — TOC Initial Note (Signed)
Transition of Care [facility]) - Initial/Assessment Note    Patient Details  Name: Michelle Bruce MRN: [id number] Date of Birth: Jul 06, 1945  Transition of Care [facility]) CM/SW Contact:    Alexander Mt, LCSW Phone Number: 04/27/2019, 10:57 AM  Clinical Narrative:                 CSW met with pt at bedside. Introduced self, role, reason for visit. Pt from home with her spouse. She has been utilizing a walker since her recent knee surgery and has been to one outpatient PT appointment. She confirms her home address and PCP. Agreeable to hospital f/u appointment. No issues reported with transportation or obtaining/affording medication. TOC team scheduled hospital f/u for Feb 25th at 2:20pm, instructions placed on discharge information. Pt aware TOC team remains available for any needs prior to discharge.   Expected Discharge Plan: Home/Self Care Barriers to Discharge: Continued Medical Work up   Patient Goals and CMS Choice Patient states their goals for this hospitalization and ongoing recovery are:: feel better and return home CMS Medicare.gov Compare Post Acute Care list provided to:: (n/a) Choice offered to / list presented to : NA  Expected Discharge Plan and Services Expected Discharge Plan: Home/Self Care In-house Referral: Clinical Social Work Discharge Planning Services: CM Consult Post Acute Care Choice: Resumption of Svcs/PTA Provider Living arrangements for the past 2 months: Single Family Home  Prior Living Arrangements/Services Living arrangements for the past 2 months: Single Family Home Lives with:: Spouse Patient language and need for interpreter reviewed:: Yes(no needs) Do you feel safe going back to the place where you live?: Yes      Need for Family Participation in Patient Care: Yes (Comment)(assistance with daily cares as needed) Care giver support system in place?: Yes (comment)(pt spouse) Current home services: DME Criminal Activity/Legal Involvement Pertinent to  Current Situation/Hospitalization: No - Comment as needed  Activities of Daily Living Home Assistive Devices/Equipment: Gilford Rile (specify type)  Permission Sought/Granted Permission sought to share information with : Facility Sport and exercise psychologist, Family Supports, PCP Permission granted to share information with : Yes, Verbal Permission Granted  Share Information with NAME: Buddy Duty  Permission granted to share info w AGENCY: PCP  Permission granted to share info w Relationship: pt spouse  Permission granted to share info w Contact Information: [phone]  Emotional Assessment Appearance:: Appears stated age Attitude/Demeanor/Rapport: Gracious, Engaged Affect (typically observed): Accepting, Adaptable, Pleasant, Appropriate Orientation: : Oriented to Self, Oriented to Place, Oriented to Situation Alcohol / Substance Use: Not Applicable Psych Involvement: No (comment)  Admission diagnosis:  Biliary disease [K83.9] Fever [R50.9] Acute cystitis with hematuria [N30.01] Sepsis ([place]) [A41.9] Sepsis, due to unspecified organism, unspecified whether acute organ dysfunction present [facility]) [A41.9] Patient Active Problem List   Diagnosis Date Noted  . Sepsis ([place]) 04/26/2019  . Essential hypertension, benign 02/20/2013  . Other and unspecified hyperlipidemia 02/20/2013  . Unspecified hypothyroidism 02/20/2013  . Potassium deficiency 02/20/2013  . Right lumbar radiculopathy 01/01/2013  . Sciatica of right side without back pain 12/30/2012  . Backache 12/05/2012  . Low back pain 10/29/2012  . Bradycardia 09/16/2012  . Microcytic anemia 02/13/2011  . Takotsubo syndrome 01/18/2011  . Chest pain 01/16/2011  . Non-STEMI (non-ST elevated myocardial infarction) ([place]) 01/16/2011  . Hypertension 01/16/2011  . Hypothyroidism 01/16/2011  . Dyslipidemia 01/16/2011  . Ocular hypertension    PCP:  Jonathon Resides, MD Pharmacy:   [place], [place] [address]  [place] [address] Phone: [phone] Fax: [place], [place] [address] Phone: [phone] Fax: [phone]  [place], [place] [address] Phone: [phone] Fax: [phone]   Readmission Risk Interventions Readmission Risk Prevention Plan 04/27/2019  Post Dischage Appt Complete  Medication Screening Complete  Transportation Screening Complete  Some recent data might be hidden

## 2019-04-27 NOTE — Progress Notes (Signed)
Received patient from ED, AOx2, VSS with elevated Temp at 100.9 in which the MD was aware since pt was in ED, right knee warm & swelling from previous right knee replacement surgery on 04/06/19, oriented to room, bed controls, call light and plan of care.  TRH admission was made aware thru text page.  Will monitor.

## 2019-04-27 NOTE — Evaluation (Signed)
Physical Therapy Evaluation Patient Details Name: Michelle Bruce MRN: [id number] DOB: 06/03/1945 Today's Date: 04/27/2019   History of Present Illness  Pt is a 74 y/o female admitted secondary to sepsis from UTI. Pt with recent R TKA. PMH includes HTN.   Clinical Impression  Pt admitted secondary to problem above with deficits below. Pt requiring min guard to min A for mobility tasks this session. Reviewed supine HEP. Pt with recent R TKA and was going to outpatient PT; recommend resuming once discharged. Will continue to follow acutely to maximize functional mobility independence and safety.     Follow Up Recommendations Outpatient PT;Supervision/Assistance - 24 hour(resume outpatient PT )    Equipment Recommendations  None recommended by PT    Recommendations for Other Services       Precautions / Restrictions Precautions Precautions: Knee;Fall Precaution Booklet Issued: No Restrictions Weight Bearing Restrictions: Yes RLE Weight Bearing: Weight bearing as tolerated      Mobility  Bed Mobility Overal bed mobility: Needs Assistance Bed Mobility: Supine to Sit     Supine to sit: Min assist     General bed mobility comments: Min A for RLE assist and trunk assist. Multimodal cues for sequencing.   Transfers Overall transfer level: Needs assistance Equipment used: Rolling walker (2 wheeled) Transfers: Sit to/from [employer] Sit to Stand: Min guard Stand pivot transfers: Min guard       General transfer comment: Min guard for safety to stand and transfer to [facility]. Required cues for safe hand placement.   Ambulation/Gait Ambulation/Gait assistance: Min guard Gait Distance (Feet): 5 Feet Assistive device: Rolling walker (2 wheeled) Gait Pattern/deviations: Step-to pattern;Decreased step length - right;Decreased step length - left;Decreased weight shift to right;Antalgic Gait velocity: Decreased   General Gait Details: Slow, antalgic gait secondary to R  knee pain. Cues for sequencing using RW.   Stairs            Wheelchair Mobility    Modified Rankin (Stroke Patients Only)       Balance Overall balance assessment: Needs assistance Sitting-balance support: No upper extremity supported;Feet supported Sitting balance-Leahy Scale: Good     Standing balance support: Bilateral upper extremity supported;During functional activity Standing balance-Leahy Scale: Poor Standing balance comment: Reliant on BUE support                             Pertinent Vitals/Pain Pain Assessment: Faces Faces Pain Scale: Hurts even more Pain Location: R knee  Pain Descriptors / Indicators: Aching;Grimacing;Guarding Pain Intervention(s): Limited activity within patient's tolerance;Monitored during session;Repositioned    Home Living Family/patient expects to be discharged to:: Private residence Living Arrangements: Spouse/significant other Available Help at Discharge: Family Type of Home: House Home Access: Stairs to enter Entrance Stairs-Rails: Doctor, general practice of Steps: 3 Home Layout: One level Home Equipment: Environmental consultant - 2 wheels;Bedside commode      Prior Function Level of Independence: Independent with assistive device(s)         Comments: Had been using RW since surgery.      Hand Dominance        Extremity/Trunk Assessment   Upper Extremity Assessment Upper Extremity Assessment: Defer to OT evaluation    Lower Extremity Assessment Lower Extremity Assessment: RLE deficits/detail RLE Deficits / Details: Recent R TKA. Deficits consistent with post op pain and weakness.        Communication   Communication: No difficulties  Cognition Arousal/Alertness: Awake/alert Behavior During Therapy:  WFL for tasks assessed/performed Overall Cognitive Status: Impaired/Different from baseline Area of Impairment: Problem solving                             Problem Solving: Slow  processing;Requires verbal cues;Requires tactile cues        General Comments General comments (skin integrity, edema, etc.): pt's husband present during session     Exercises Total Joint Exercises Ankle Circles/Pumps: AROM;Both;15 reps;Supine Heel Slides: AROM;Right;5 reps;Supine   Assessment/Plan    PT Assessment Patient needs continued PT services  PT Problem List Decreased strength;Decreased balance;Decreased mobility;Decreased cognition;Decreased safety awareness;Decreased knowledge of precautions;Pain       PT Treatment Interventions Stair training;Gait training;DME instruction;Therapeutic activities;Functional mobility training;Therapeutic exercise;Balance training;Patient/family education    PT Goals (Current goals can be found in the Care Plan section)  Acute Rehab PT Goals Patient Stated Goal: to go home PT Goal Formulation: With patient Time For Goal Achievement: 05/11/19 Potential to Achieve Goals: Good    Frequency Min 3X/week   Barriers to discharge        Co-evaluation               AM-PAC PT "6 Clicks" Mobility  Outcome Measure Help needed turning from your back to your side while in a flat bed without using bedrails?: A Little Help needed moving from lying on your back to sitting on the side of a flat bed without using bedrails?: A Little Help needed moving to and from a bed to a chair (including a wheelchair)?: A Little Help needed standing up from a chair using your arms (e.g., wheelchair or bedside chair)?: A Little Help needed to walk in hospital room?: A Little Help needed climbing 3-5 steps with a railing? : A Lot 6 Click Score: 17    End of Session Equipment Utilized During Treatment: Gait belt Activity Tolerance: Patient tolerated treatment well Patient left: in chair;with call bell/phone within reach;with family/visitor present Nurse Communication: Mobility status PT Visit Diagnosis: Muscle weakness (generalized) (M62.81);Unsteadiness  on feet (R26.81);Pain Pain - Right/Left: Right Pain - part of body: Knee    Time: 1222-1249 PT Time Calculation (min) (ACUTE ONLY): 27 min   Charges:   PT Evaluation $PT Eval Moderate Complexity: 1 Mod PT Treatments $Therapeutic Activity: 8-22 mins        Lou Miner, DPT  Acute Rehabilitation Services  Pager: [phone] Office: [phone]   Rudean Hitt 04/27/2019, 12:58 PM

## 2019-04-27 NOTE — Progress Notes (Signed)
PROGRESS NOTE    Michelle Bruce  JGG:[id number] DOB: 09/08/1945 DOA: 04/26/2019 PCP: [name], MD   Brief Narrative:  74 year old female with history of HTN, hypothyroidism, right knee replacement, hyperlipidemia brought to the hospital for change in mental status.  Diagnosed with sepsis secondary to urinary tract infection.  Started on vancomycin and Rocephin.   Assessment & Plan:   Active Problems:   Hypertension   Hypothyroidism   Dyslipidemia   Sepsis (HCC)  Acute metabolic encephalopathy secondary to underlying infection Sepsis secondary to urinary tract infection -Follow-up culture data.  Continue empiric Rocephin.  Discontinue vancomycin -Mentation continues to improve.  Currently she is alert awake oriented X2-3.  Does not exactly member what happened yesterday and overall feels weak. -Check TSH, B12 and ammonia levels. -Procalcitonin 3.18.  Hepatitis C antibody positive -Does not know if she has previously been infected.  Will check HCV quantitative levels.  If necessary will make ID referral. -Check ammonia level.  Right upper quadrant ultrasound-negative  Essential hypertension -Current clinical signs of dehydration.  As necessary we will slowly resume medications.  Hyperlipidemia -On Crestor  Hypothyroidism -Synthroid 100 mcg daily.  Generalized weakness -Get PT/OT.  DVT prophylaxis: Lovenox Code Status: Full Family Communication: None Disposition Plan:   Patient From= home  Patient Anticipated D/C place= to be determined  Barriers= maintain hospital stay until more culture data has been available in the setting of sepsis.  In the meantime patient will require PT/OT and gentle hydration.   Subjective: Seen and examined at bedside, no acute events overnight.  This morning she appears to be little more awake and interactive but does not exact remember the events from yesterday.  Review of Systems Otherwise negative except as per HPI,  including: General: Denies fever, chills, night sweats or unintended weight loss. Resp: Denies cough, wheezing, shortness of breath. Cardiac: Denies chest pain, palpitations, orthopnea, paroxysmal nocturnal dyspnea. GI: Denies abdominal pain, nausea, vomiting, diarrhea or constipation GU: Denies dysuria, frequency, hesitancy or incontinence MS: Denies muscle aches, joint pain or swelling Neuro: Denies headache, neurologic deficits (focal weakness, numbness, tingling), abnormal gait Psych: Denies anxiety, depression, SI/HI/AVH Skin: Denies new rashes or lesions ID: Denies sick contacts, exotic exposures, travel  Examination:  General exam: Calm comfortable.  Slightly disoriented but overall alert awake oriented 2-3. Respiratory system: Clear to auscultation. Respiratory effort normal. Cardiovascular system: S1 & S2 heard, RRR. No JVD, murmurs, rubs, gallops or clicks. No pedal edema. Gastrointestinal system: Abdomen is nondistended, soft and nontender. No organomegaly or masses felt. Normal bowel sounds heard. Central nervous system: Alert and oriented. No focal neurological deficits. Extremities: Symmetric 5 x 5 power. Skin: No rashes, lesions or ulcers Psychiatry: Judgment is slightly clouded but overall feeling much better.    Objective: Vitals:   04/27/19 0000 04/27/19 0042 04/27/19 0639 04/27/19 1000  BP: (!) 143/74 (!) 141/69 (!) 143/80   Pulse: 99 98 92   Resp: 12 16 16    Temp:  (!) 100.9 F (38.3 C) 98.9 F (37.2 C) 99.3 F (37.4 C)  TempSrc:  Oral Oral   SpO2: 95% 97% 98%   Weight:  79.1 kg    Height:  5' 6.5" (1.689 m)      Intake/Output Summary (Last 24 hours) at 04/27/2019 1117 Last data filed at 04/27/2019 1027 Gross per 24 hour  Intake 2857 ml  Output 700 ml  Net 2157 ml   Filed Weights   04/27/19 0042  Weight: 79.1 kg     Data  Reviewed:   CBC: Recent Labs  Lab 04/26/19 1816 04/27/19 0109  WBC 9.5 7.9  NEUTROABS 7.8*  --   HGB 10.6* 9.7*  HCT  32.8* 30.2*  MCV 77.2* 76.1*  PLT 393 660   Basic Metabolic Panel: Recent Labs  Lab 04/26/19 1816 04/27/19 0109  NA 134* 133*  K 4.0 3.4*  CL 96* 102  CO2 23 18*  GLUCOSE 118* 163*  BUN 33* 27*  CREATININE 1.07* 1.09*  CALCIUM 8.6* 7.9*   GFR: Estimated Creatinine Clearance: 48.5 mL/min (A) (by C-G formula based on SCr of 1.09 mg/dL (H)). Liver Function Tests: Recent Labs  Lab 04/26/19 1816 04/27/19 0109  AST 43* 35  ALT 48* 39  ALKPHOS 217* 203*  BILITOT 0.9 0.9  PROT 6.4* 5.6*  ALBUMIN 2.3* 2.0*   No results for input(s): LIPASE, AMYLASE in the last 168 hours. No results for input(s): AMMONIA in the last 168 hours. Coagulation Profile: Recent Labs  Lab 04/26/19 1816  INR 1.0   Cardiac Enzymes: No results for input(s): CKTOTAL, CKMB, CKMBINDEX, TROPONINI in the last 168 hours. BNP (last 3 results) No results for input(s): PROBNP in the last 8760 hours. HbA1C: No results for input(s): HGBA1C in the last 72 hours. CBG: No results for input(s): GLUCAP in the last 168 hours. Lipid Profile: No results for input(s): CHOL, HDL, LDLCALC, TRIG, CHOLHDL, LDLDIRECT in the last 72 hours. Thyroid Function Tests: No results for input(s): TSH, T4TOTAL, FREET4, T3FREE, THYROIDAB in the last 72 hours. Anemia Panel: No results for input(s): VITAMINB12, FOLATE, FERRITIN, TIBC, IRON, RETICCTPCT in the last 72 hours. Sepsis Labs: Recent Labs  Lab 04/26/19 1816  PROCALCITON 3.18  LATICACIDVEN 1.0    Recent Results (from the past 240 hour(s))  Blood Culture (routine x 2)     Status: None (Preliminary result)   Collection Time: 04/26/19  6:16 PM   Specimen: BLOOD  Result Value Ref Range Status   Specimen Description BLOOD LEFT ANTECUBITAL  Final   Special Requests   Final    BOTTLES DRAWN AEROBIC ONLY Blood Culture adequate volume   Culture   Final    NO GROWTH < 12 HOURS Performed at [facility], [place] [address] [place], [place] [address]    Report Status  PENDING  Incomplete  Blood Culture (routine x 2)     Status: None (Preliminary result)   Collection Time: 04/26/19  6:16 PM   Specimen: BLOOD LEFT ARM  Result Value Ref Range Status   Specimen Description BLOOD LEFT ARM  Final   Special Requests   Final    BOTTLES DRAWN AEROBIC ONLY Blood Culture adequate volume   Culture   Final    NO GROWTH < 12 HOURS Performed at [facility], [place] [address] [place], [place] [address]    Report Status PENDING  Incomplete  Respiratory Panel by RT PCR (Flu A&B, Covid) - Nasopharyngeal Swab     Status: None   Collection Time: 04/26/19  7:24 PM   Specimen: Nasopharyngeal Swab  Result Value Ref Range Status   SARS Coronavirus 2 by RT PCR NEGATIVE NEGATIVE Final    Comment: (NOTE) SARS-CoV-2 target nucleic acids are NOT DETECTED. The SARS-CoV-2 RNA is generally detectable in upper respiratoy specimens during the acute phase of infection. The lowest concentration of SARS-CoV-2 viral copies this assay can detect is 131 copies/mL. A negative result does not preclude SARS-Cov-2 infection and should not be used as the sole basis for treatment  or other patient management decisions. A negative result may occur with  improper specimen collection/handling, submission of specimen other than nasopharyngeal swab, presence of viral mutation(s) within the areas targeted by this assay, and inadequate number of viral copies (<131 copies/mL). A negative result must be combined with clinical observations, patient history, and epidemiological information. The expected result is Negative. Fact Sheet for Patients:  https://www.moore.com/ Fact Sheet for Healthcare Providers:  https://www.young.biz/ This test is not yet ap proved or cleared by the [place] FDA and  has been authorized for detection and/or diagnosis of SARS-CoV-2 by FDA under an Emergency Use Authorization (EUA). This EUA will remain  in effect (meaning  this test can be used) for the duration of the COVID-19 declaration under Section 564(b)(1) of the Act, 21 U.S.C. section 360bbb-3(b)(1), unless the authorization is terminated or revoked sooner.    Influenza A by PCR NEGATIVE NEGATIVE Final   Influenza B by PCR NEGATIVE NEGATIVE Final    Comment: (NOTE) The Xpert Xpress SARS-CoV-2/FLU/RSV assay is intended as an aid in  the diagnosis of influenza from Nasopharyngeal swab specimens and  should not be used as a sole basis for treatment. Nasal washings and  aspirates are unacceptable for Xpert Xpress SARS-CoV-2/FLU/RSV  testing. Fact Sheet for Patients: https://www.moore.com/ Fact Sheet for Healthcare Providers: https://www.young.biz/ This test is not yet approved or cleared by the [place] FDA and  has been authorized for detection and/or diagnosis of SARS-CoV-2 by  FDA under an Emergency Use Authorization (EUA). This EUA will remain  in effect (meaning this test can be used) for the duration of the  Covid-19 declaration under Section 564(b)(1) of the Act, 21  U.S.C. section 360bbb-3(b)(1), unless the authorization is  terminated or revoked. Performed at [facility], [address], [place], [place] [address]          Radiology Studies: DG Chest Port 1 View  Result Date: 04/26/2019 CLINICAL DATA:  Sepsis, confusion, fever EXAM: PORTABLE CHEST 1 VIEW COMPARISON:  11/30/2012 FINDINGS: The heart size and mediastinal contours are within normal limits. Both lungs are clear. The visualized skeletal structures are unremarkable. IMPRESSION: No active disease. Electronically Signed   By: Sharlet Salina M.D.   On: 04/26/2019 18:30   US Abdomen Limited RUQ  Result Date: 04/26/2019 CLINICAL DATA:  Possible biliary disease, sepsis confusion EXAM: ULTRASOUND ABDOMEN LIMITED RIGHT UPPER QUADRANT COMPARISON:  None. CT 01/16/2011 FINDINGS: Gallbladder: No shadowing stone. Borderline increased  wall thickness. No sonographic Eulah Pont indicated Common bile duct: Diameter: 2.6 mm Liver: Liver may be slightly echogenic. No focal hepatic abnormality portal vein is patent on color Doppler imaging with normal direction of blood flow towards the liver. Other: None. IMPRESSION: 1. Negative for gallstones or biliary dilatation 2. Possible slightly echogenic liver as may be seen with mild steatosis Electronically Signed   By: Jasmine Pang M.D.   On: 04/26/2019 22:02        Scheduled Meds: . dorzolamide  1 drop Both Eyes BID  . enoxaparin (LOVENOX) injection  40 mg Subcutaneous Daily  . irbesartan  300 mg Oral Daily   And  . hydrochlorothiazide  12.5 mg Oral Daily  . latanoprost  1 drop Both Eyes QHS  . levothyroxine  100 mcg Oral QAC breakfast  . liothyronine  5 mcg Oral Daily  . rosuvastatin  20 mg Oral Daily  . timolol  1 drop Both Eyes Daily   Continuous Infusions: . cefTRIAXone (ROCEPHIN)  IV    . vancomycin  LOS: 1 day   Time spent= 35 mins    Jaspreet Bodner Joline Maxcy, MD [facility]  If 7PM-7AM, please contact night-coverage  04/27/2019, 11:18 AM

## 2019-04-28 DIAGNOSIS — G9341 Metabolic encephalopathy: Secondary | ICD-10-CM

## 2019-04-28 DIAGNOSIS — Z1612 Extended spectrum beta lactamase (ESBL) resistance: Secondary | ICD-10-CM

## 2019-04-28 DIAGNOSIS — A498 Other bacterial infections of unspecified site: Secondary | ICD-10-CM

## 2019-04-28 LAB — COMPREHENSIVE METABOLIC PANEL
ALT: 42 U/L (ref 0–44)
AST: 43 U/L — ABNORMAL HIGH (ref 15–41)
Albumin: 1.8 g/dL — ABNORMAL LOW (ref 3.5–5.0)
Alkaline Phosphatase: 182 U/L — ABNORMAL HIGH (ref 38–126)
Anion gap: 12 (ref 5–15)
BUN: 16 mg/dL (ref 8–23)
CO2: 21 mmol/L — ABNORMAL LOW (ref 22–32)
Calcium: 7.9 mg/dL — ABNORMAL LOW (ref 8.9–10.3)
Chloride: 102 mmol/L (ref 98–111)
Creatinine, Ser: 0.73 mg/dL (ref 0.44–1.00)
GFR calc Af Amer: >60 mL/min (ref >60)
GFR calc non Af Amer: >60 mL/min (ref >60)
Glucose, Bld: 120 mg/dL — ABNORMAL HIGH (ref 70–99)
Potassium: 3 mmol/L — ABNORMAL LOW (ref 3.5–5.1)
Sodium: 135 mmol/L (ref 135–145)
Total Bilirubin: 0.5 mg/dL (ref 0.3–1.2)
Total Protein: 5.3 g/dL — ABNORMAL LOW (ref 6.5–8.1)

## 2019-04-28 LAB — FERRITIN: Ferritin: 148 ng/mL (ref 11–307)

## 2019-04-28 LAB — CBC
HCT: 27.1 % — ABNORMAL LOW (ref 36.0–46.0)
Hemoglobin: 8.8 g/dL — ABNORMAL LOW (ref 12.0–15.0)
MCH: 24.5 pg — ABNORMAL LOW (ref 26.0–34.0)
MCHC: 32.5 g/dL (ref 30.0–36.0)
MCV: 75.5 fL — ABNORMAL LOW (ref 80.0–100.0)
Platelets: 385 10*3/uL (ref 150–400)
RBC: 3.59 MIL/uL — ABNORMAL LOW (ref 3.87–5.11)
RDW: 16.8 % — ABNORMAL HIGH (ref 11.5–15.5)
WBC: 8 10*3/uL (ref 4.0–10.5)
nRBC: 0 % (ref 0.0–0.2)

## 2019-04-28 LAB — URINE CULTURE: Culture: 90000 — AB

## 2019-04-28 LAB — RETICULOCYTES
Immature Retic Fract: 10.2 % (ref 2.3–15.9)
RBC.: 3.85 MIL/uL — ABNORMAL LOW (ref 3.87–5.11)
Retic Count, Absolute: 27.3 10*3/uL (ref 19.0–186.0)
Retic Ct Pct: 0.7 % (ref 0.4–3.1)

## 2019-04-28 LAB — IRON AND TIBC
Iron: 43 ug/dL (ref 28–170)
Saturation Ratios: 22 % (ref 10.4–31.8)
TIBC: 192 ug/dL — ABNORMAL LOW (ref 250–450)
UIBC: 149 ug/dL

## 2019-04-28 LAB — HCV RNA QUANT: HCV Quantitative: NOT DETECTED IU/mL (ref >50)

## 2019-04-28 LAB — MAGNESIUM: Magnesium: 2.3 mg/dL (ref 1.7–2.4)

## 2019-04-28 LAB — AMMONIA: Ammonia: 21 umol/L (ref 9–35)

## 2019-04-28 LAB — FOLATE: Folate: 12.5 ng/mL (ref >5.9)

## 2019-04-28 MED ORDER — FOSFOMYCIN TROMETHAMINE 3 G PO PACK: PACK | ORAL | 0 refills | Status: DC

## 2019-04-28 MED ORDER — FOSFOMYCIN TROMETHAMINE 3 G PO PACK
3.0000 g | PACK | ORAL | Status: DC
  Filled 2019-04-28: qty 3

## 2019-04-28 MED ORDER — POTASSIUM CHLORIDE CRYS ER 20 MEQ PO TBCR
40.0000 meq | EXTENDED_RELEASE_TABLET | ORAL | Status: AC
  Administered 2019-04-28 (×2): 40 meq via ORAL
  Filled 2019-04-28 (×2): qty 2

## 2019-04-28 MED ORDER — SODIUM CHLORIDE 0.9 % IV SOLN
1.0000 g | Freq: Three times a day (TID) | INTRAVENOUS | Status: AC
  Administered 2019-04-28 (×2): 1 g via INTRAVENOUS
  Filled 2019-04-28 (×3): qty 1

## 2019-04-28 MED FILL — FOSFOMYCIN TROMETHAMINE 3 G: 3 | 2 days supply | Qty: 3 | Fill #0

## 2019-04-28 NOTE — Social Work (Signed)
Acknowledging consult for outpatient PT. Pt already active with outpatient PT at [facility], as arranged after her orthopedic procedure. At this time pt to continue services as already arranged.   Octavio Graves, MSW, LCSW [facility] Clinical Social Work

## 2019-04-28 NOTE — Discharge Summary (Signed)
Physician Discharge Summary  Michelle Bruce EHU:[id number] DOB: May 31, 1945 DOA: 04/26/2019  PCP: [name], MD  Admit date: 04/26/2019 Discharge date: 04/28/2019  Time spent: 60 minutes  Recommendations for Outpatient Follow-up:  1. Follow-up with [name], MD on 05/06/2019.  On follow-up patient will need a basic metabolic profile done to follow-up on electrolytes and renal function.  Patient also need a CBC done to follow-up on H&H. 2. Patient was discharged home with home health therapies.   Discharge Diagnoses:  Principal Problem:   Sepsis (HCC) Active Problems:   Infection due to ESBL-producing Escherichia coli   Acute metabolic encephalopathy   Hypertension   Hypothyroidism   Dyslipidemia   Discharge Condition: Stable and improved  Diet recommendation: Heart healthy  Filed Weights   04/27/19 0042  Weight: 79.1 kg    History of present illness:  HPI per Dr. Augustine Radar is a 74 y.o. female with hx of HTN, hypothyroidism, recent R knee replacement about ten days ago, who presented for confusion.  History primarily via ED provider as patient unable to engage significantly in interview due to confusion. Per signout husband brought patient to ED due to fevers and confusion at home but otherwise had been doing well since her knee surgery.   Per ED provider was seen by Ortho with low suspicion for septic joint. Eval notable for fever to 102.4 F but otherwise unremarkable vitals, neg COVID test, normal procal, CMP w mildly elevated Cr 1.07 and AST 43/ALT 48, normal lactice acid, normal white count, UA with some leuks. Unremarkable CXR and RUQ [place]. She was given 3L IVF and started on Ceftriaxone/Vanc for possible urinary source.   On interview patient was arousable and oriented to person and time but not to place. She denied feeling confused at present. Endorsed nausea, but denied burning or pain with urination, chest pain, SOB, cough, back pain. Also denied  alcohol intake.   Hospital Course:  #1 acute metabolic encephalopathy secondary to ESBL E. coli UTI/sepsis secondary to ESBL E. coli UTI Patient was admitted with ongoing fevers as high as 104 per husband, confusion, urinalysis with large leukocytes, nitrite negative, greater than 50 WBCs.  Patient noted to have had some suprapubic tenderness and recent knee replacement.  Ammonia levels within normal limits.  TSH noted to be 2.768.  Vitamin B12 at 303.  Patient was placed empirically on IV vancomycin and IV Rocephin and pancultured.  Right upper quadrant ultrasound which was done was negative.  Urinalysis came back positive for ESBL E. coli.  IV antibiotics were narrowed down IV vancomycin discontinued.  IV Rocephin also discontinued after urine culture results and sensitivities had resulted.  Patient placed on IV Merrem.  Patient improved clinically.  Patient remained afebrile.  Patient will be discharged home on oral fosfomycin every other day x3 doses.  Patient was discharged home in stable and improved condition.  2.  Hepatitis C antibody positive Patient noted to have a positive hepatitis C antibody.  Right upper quadrant ultrasound which was done was negative for any biliary abnormalities however consistent with some steatosis.  Hep C viral quantitative levels were negative.  Ammonia level within normal limits at 21.  Outpatient follow-up with PCP.  3.  Anemia of chronic disease Hemoglobin slowly trended down during the hospitalization from 10.6 to 8.8 by day of discharge.  Likely dilutional in nature.  Patient with no overt bleeding.  Anemia panel which was done was consistent with anemia of chronic disease.  Outpatient follow-up with PCP.  4.  Hypertension Remained stable.  Patient placed on IV fluids as patient noted to be dehydrated.  Outpatient follow-up.  5.  Hyperlipidemia Patient maintained on statin.  6.  Hypothyroidism TSH within normal limits at 2.768.  Patient maintained on  home regimen of Synthroid.  7.  Generalized weakness Patient seen by PT OT.  Home health therapies ordered.  8.  Status post recent right TKA Patient seen by PT OT.  Patient will be discharged home with home health therapies.  Procedures:  Right upper quadrant abdominal ultrasound 04/26/2019  Chest x-ray 04/26/2019  Consultations:  None  Discharge Exam: Vitals:   04/28/19 0539 04/28/19 1403  BP: 132/64 136/62  Pulse: 61 66  Resp: (!) 21 18  Temp: 98.5 F (36.9 C) 98.8 F (37.1 C)  SpO2: 98% 99%    General: NAD Cardiovascular: RRR Respiratory: CTAB  Discharge Instructions   Discharge Instructions    Ambulatory referral to Physical Therapy   Complete by: As directed    Diet - low sodium heart healthy   Complete by: As directed    Increase activity slowly   Complete by: As directed      Allergies as of 04/28/2019      Reactions   Atenolol Hydrochloride Cough   Vicodin [hydrocodone-acetaminophen]    Hyper      Medication List    STOP taking these medications   losartan 100 MG tablet Commonly known as: COZAAR     TAKE these medications   acetaminophen 500 MG tablet Commonly known as: TYLENOL Take 500 mg by mouth every 6 (six) hours as needed for mild pain, moderate pain or fever.   calcium citrate-vitamin D 315-200 MG-UNIT tablet Commonly known as: CITRACAL+D Take 1-2 tablets by mouth daily.   dorzolamide 2 % ophthalmic solution Commonly known as: TRUSOPT Place 1 drop into both eyes 2 (two) times daily.   estradiol 0.1 MG/24HR patch Commonly known as: VIVELLE-DOT Place 1 patch onto the skin 2 (two) times a week. Change on Wednesday and Saturday   fosfomycin 3 g Pack Commonly known as: MONUROL Take 1 tablet every other day starting 04/29/19   ibuprofen 200 MG tablet Commonly known as: ADVIL Take 200 mg by mouth every 6 (six) hours as needed for fever or moderate pain.   levothyroxine 100 MCG tablet Commonly known as: SYNTHROID Take 100 mcg  by mouth daily before breakfast.   levothyroxine 75 MCG tablet Commonly known as: SYNTHROID Take 1 tablet (75 mcg total) by mouth daily.   liothyronine 5 MCG tablet Commonly known as: CYTOMEL Take 5 mcg by mouth daily. What changed: Another medication with the same name was removed. Continue taking this medication, and follow the directions you see here.   methocarbamol 500 MG tablet Commonly known as: ROBAXIN Take 1 tablet by mouth 3 (three) times daily as needed for muscle spasms.   Neurontin 300 MG capsule Generic drug: gabapentin Take 300 mg by mouth See admin instructions. 1 tablet by mouth 3 times daily for 2 weeks then 1 tablet by mouth twice daily for 2 weeks then 1 tablet by mouth once daily for 2 weeks.   ondansetron 4 MG tablet Commonly known as: ZOFRAN Take 1 tablet by mouth every 6 (six) hours as needed for nausea.   oxyCODONE 5 MG immediate release tablet Commonly known as: Oxy IR/ROXICODONE Take 1 tablet by mouth every 4 (four) hours as needed for moderate pain or severe pain.   potassium chloride  10 MEQ tablet Commonly known as: KLOR-CON Take 2 tablets (20 mEq total) by mouth 2 (two) times daily.   PRESCRIPTION MEDICATION Take 1 tablet by mouth daily. Pink pill, oval in shape with an L and a 200   rosuvastatin 20 MG tablet Commonly known as: CRESTOR Take 0.5 tablets (10 mg total) by mouth daily. What changed: how much to take   telmisartan-hydrochlorothiazide 80-12.5 MG tablet Commonly known as: MICARDIS HCT Take 1 tablet by mouth daily.   timolol 0.5 % ophthalmic solution Commonly known as: TIMOPTIC Place 1 drop into both eyes daily.   traMADol 50 MG tablet Commonly known as: ULTRAM Take 1-2 tablets by mouth every 6 (six) hours as needed for moderate pain.   Travatan Z 0.004 % Soln ophthalmic solution Generic drug: Travoprost (BAK Free) Place 1 drop into both eyes at bedtime.   zolpidem 10 MG tablet Commonly known as: AMBIEN Take 5 mg by mouth  at bedtime.            Durable Medical Equipment  (From admission, onward)         Start     Ordered   04/28/19 1546  For home use only DME Walker rolling  Once    Question Answer Comment  Walker: With 5 Inch Wheels   Patient needs a walker to treat with the following condition Weakness      04/28/19 1545         Allergies  Allergen Reactions  . Atenolol Hydrochloride Cough  . Vicodin [Hydrocodone-Acetaminophen]     Hyper    Follow-up Information    [name], MD Follow up on 05/06/2019.   Specialty: Family Medicine Why: your hospital follow up is at 2:20pm; call if you need to change and please wear a mask. Contact information: [address] [place] [address] [phone]        [facility], [facility] Follow up.   Specialty: Home Health Services Contact information: [address] [place] [address] [phone]            The results of significant diagnostics from this hospitalization (including imaging, microbiology, ancillary and laboratory) are listed below for reference.    Significant Diagnostic Studies: DG Chest Port 1 View  Result Date: 04/26/2019 CLINICAL DATA:  Sepsis, confusion, fever EXAM: PORTABLE CHEST 1 VIEW COMPARISON:  11/30/2012 FINDINGS: The heart size and mediastinal contours are within normal limits. Both lungs are clear. The visualized skeletal structures are unremarkable. IMPRESSION: No active disease. Electronically Signed   By: Sharlet Salina M.D.   On: 04/26/2019 18:30   US Abdomen Limited RUQ  Result Date: 04/26/2019 CLINICAL DATA:  Possible biliary disease, sepsis confusion EXAM: ULTRASOUND ABDOMEN LIMITED RIGHT UPPER QUADRANT COMPARISON:  None. CT 01/16/2011 FINDINGS: Gallbladder: No shadowing stone. Borderline increased wall thickness. No sonographic Eulah Pont indicated Common bile duct: Diameter: 2.6 mm Liver: Liver may be slightly echogenic. No focal hepatic abnormality portal vein is  patent on color Doppler imaging with normal direction of blood flow towards the liver. Other: None. IMPRESSION: 1. Negative for gallstones or biliary dilatation 2. Possible slightly echogenic liver as may be seen with mild steatosis Electronically Signed   By: Jasmine Pang M.D.   On: 04/26/2019 22:02    Microbiology: Recent Results (from the past 240 hour(s))  Blood Culture (routine x 2)     Status: None (Preliminary result)   Collection Time: 04/26/19  6:16 PM   Specimen: BLOOD  Result  Value Ref Range Status   Specimen Description BLOOD LEFT ANTECUBITAL  Final   Special Requests   Final    BOTTLES DRAWN AEROBIC ONLY Blood Culture adequate volume   Culture   Final    NO GROWTH 2 DAYS Performed at [facility], [address] [place], [place] [address]    Report Status PENDING  Incomplete  Blood Culture (routine x 2)     Status: None (Preliminary result)   Collection Time: 04/26/19  6:16 PM   Specimen: BLOOD LEFT ARM  Result Value Ref Range Status   Specimen Description BLOOD LEFT ARM  Final   Special Requests   Final    BOTTLES DRAWN AEROBIC ONLY Blood Culture adequate volume   Culture   Final    NO GROWTH 2 DAYS Performed at [facility], [place] [address] [place], [place] [address]    Report Status PENDING  Incomplete  Respiratory Panel by RT PCR (Flu A&B, Covid) - Nasopharyngeal Swab     Status: None   Collection Time: 04/26/19  7:24 PM   Specimen: Nasopharyngeal Swab  Result Value Ref Range Status   SARS Coronavirus 2 by RT PCR NEGATIVE NEGATIVE Final    Comment: (NOTE) SARS-CoV-2 target nucleic acids are NOT DETECTED. The SARS-CoV-2 RNA is generally detectable in upper respiratoy specimens during the acute phase of infection. The lowest concentration of SARS-CoV-2 viral copies this assay can detect is 131 copies/mL. A negative result does not preclude SARS-Cov-2 infection and should not be used as the sole basis for treatment or other patient management  decisions. A negative result may occur with  improper specimen collection/handling, submission of specimen other than nasopharyngeal swab, presence of viral mutation(s) within the areas targeted by this assay, and inadequate number of viral copies (<131 copies/mL). A negative result must be combined with clinical observations, patient history, and epidemiological information. The expected result is Negative. Fact Sheet for Patients:  PinkCheek.be Fact Sheet for Healthcare Providers:  GravelBags.it This test is not yet ap proved or cleared by the [place] FDA and  has been authorized for detection and/or diagnosis of SARS-CoV-2 by FDA under an Emergency Use Authorization (EUA). This EUA will remain  in effect (meaning this test can be used) for the duration of the COVID-19 declaration under Section 564(b)(1) of the Act, 21 U.S.C. section 360bbb-3(b)(1), unless the authorization is terminated or revoked sooner.    Influenza A by PCR NEGATIVE NEGATIVE Final   Influenza B by PCR NEGATIVE NEGATIVE Final    Comment: (NOTE) The Xpert Xpress SARS-CoV-2/FLU/RSV assay is intended as an aid in  the diagnosis of influenza from Nasopharyngeal swab specimens and  should not be used as a sole basis for treatment. Nasal washings and  aspirates are unacceptable for Xpert Xpress SARS-CoV-2/FLU/RSV  testing. Fact Sheet for Patients: PinkCheek.be Fact Sheet for Healthcare Providers: GravelBags.it This test is not yet approved or cleared by the [place] FDA and  has been authorized for detection and/or diagnosis of SARS-CoV-2 by  FDA under an Emergency Use Authorization (EUA). This EUA will remain  in effect (meaning this test can be used) for the duration of the  Covid-19 declaration under Section 564(b)(1) of the Act, 21  U.S.C. section 360bbb-3(b)(1), unless the authorization  is  terminated or revoked. Performed at [facility], [place] [address] [place], [place] [address]   Urine culture     Status: Abnormal   Collection Time: 04/26/19  9:18 PM  Specimen: In/Out Cath Urine  Result Value Ref Range Status   Specimen Description IN/OUT CATH URINE  Final   Special Requests   Final    NONE Performed at [facility], [address] [place], [place] [address]    Culture (A)  Final    90,000 COLONIES/mL ESCHERICHIA COLI Confirmed Extended Spectrum Beta-Lactamase Producer (ESBL).  In bloodstream infections from ESBL organisms, carbapenems are preferred over piperacillin/tazobactam. They are shown to have a lower risk of mortality.    Report Status 04/28/2019 FINAL  Final   Organism ID, Bacteria ESCHERICHIA COLI (A)  Final      Susceptibility   Escherichia coli - MIC*    AMPICILLIN >=32 RESISTANT Resistant     CEFAZOLIN >=64 RESISTANT Resistant     CEFTRIAXONE >=64 RESISTANT Resistant     CIPROFLOXACIN >=4 RESISTANT Resistant     GENTAMICIN >=16 RESISTANT Resistant     IMIPENEM <=0.25 SENSITIVE Sensitive     NITROFURANTOIN <=16 SENSITIVE Sensitive     TRIMETH/SULFA >=320 RESISTANT Resistant     AMPICILLIN/SULBACTAM >=32 RESISTANT Resistant     PIP/TAZO 8 SENSITIVE Sensitive     * 90,000 COLONIES/mL ESCHERICHIA COLI     Labs: Basic Metabolic Panel: Recent Labs  Lab 04/26/19 1816 04/27/19 0109 04/28/19 0126  NA 134* 133* 135  K 4.0 3.4* 3.0*  CL 96* 102 102  CO2 23 18* 21*  GLUCOSE 118* 163* 120*  BUN 33* 27* 16  CREATININE 1.07* 1.09* 0.73  CALCIUM 8.6* 7.9* 7.9*  MG  --   --  2.3   Liver Function Tests: Recent Labs  Lab 04/26/19 1816 04/27/19 0109 04/28/19 0126  AST 43* 35 43*  ALT 48* 39 42  ALKPHOS 217* 203* 182*  BILITOT 0.9 0.9 0.5  PROT 6.4* 5.6* 5.3*  ALBUMIN 2.3* 2.0* 1.8*   No results for input(s): LIPASE, AMYLASE in the last 168 hours. Recent Labs  Lab 04/28/19 0126  AMMONIA 21   CBC: Recent Labs  Lab  04/26/19 1816 04/27/19 0109 04/28/19 0126  WBC 9.5 7.9 8.0  NEUTROABS 7.8*  --   --   HGB 10.6* 9.7* 8.8*  HCT 32.8* 30.2* 27.1*  MCV 77.2* 76.1* 75.5*  PLT 393 379 385   Cardiac Enzymes: No results for input(s): CKTOTAL, CKMB, CKMBINDEX, TROPONINI in the last 168 hours. BNP: BNP (last 3 results) No results for input(s): BNP in the last 8760 hours.  ProBNP (last 3 results) No results for input(s): PROBNP in the last 8760 hours.  CBG: No results for input(s): GLUCAP in the last 168 hours.     Signed:  Ramiro Harvest MD.  [facility] 04/28/2019, 5:13 PM

## 2019-04-28 NOTE — Evaluation (Addendum)
Occupational Therapy Evaluation Patient Details Name: Michelle Bruce MRN: [id number] DOB: 17-Oct-1945 Today's Date: 04/28/2019    History of Present Illness Pt is a 74 y/o female admitted secondary to sepsis from UTI. Pt with recent R TKA. PMH includes HTN.    Clinical Impression   Pt with decline in function and safety with ADLs and ADL mobility with impaired balance and endurance. PTA pt lived at home with her husband and before recent knee surgery pt was independent with ADLs/selfcare but reports that she has been using a w/c since knee surgery. Pt currently requires min A with LB ADLs, min guard A with toileting and min guard A - sup with mobility using RW. Pt's husband present, very supportive and pt very pleasant and cooperative. Pt would benefit from acute OT services to address impairments to maximize level of function and safety    Follow Up Recommendations  No OT follow up;Supervision - Intermittent    Equipment Recommendations  None recommended by OT    Recommendations for Other Services       Precautions / Restrictions Precautions Precautions: Knee;Fall Precaution Booklet Issued: No Restrictions Weight Bearing Restrictions: Yes RLE Weight Bearing: Weight bearing as tolerated      Mobility Bed Mobility               General bed mobility comments: pt in recliner upon OT arrival  Transfers Overall transfer level: Needs assistance Equipment used: Rolling walker (2 wheeled) Transfers: Sit to/from [facility] Sit to Stand: Min guard Stand pivot transfers: Supervision       General transfer comment: Min guard for safety to stand from recliner, ambulate and transfer with sup to 3 in 1 over toilet in bathroom    Balance Overall balance assessment: Needs assistance Sitting-balance support: No upper extremity supported;Feet supported Sitting balance-Leahy Scale: Good     Standing balance support: Bilateral upper extremity supported;During  functional activity Standing balance-Leahy Scale: Poor                             ADL either performed or assessed with clinical judgement   ADL Overall ADL's : Needs assistance/impaired Eating/Feeding: Independent;Sitting   Grooming: Wash/dry hands;Wash/dry face;Supervision/safety;Standing;With caregiver independent assisting   Upper Body Bathing: Set up;Independent;Sitting;With caregiver independent assisting   Lower Body Bathing: Minimal assistance;With caregiver independent assisting   Upper Body Dressing : Set up;Independent;Sitting;With caregiver independent assisting   Lower Body Dressing: Minimal assistance;With caregiver independent assisting   Toilet Transfer: Min guard;Supervision/safety;Ambulation;RW;Comfort height toilet;Grab bars;Cueing for safety;With caregiver independent assisting Toilet Transfer Details (indicate cue type and reason): 3 in 1 Toileting- Clothing Manipulation and Hygiene: Min guard;Sit to/from stand;With caregiver independent assisting   Tub/ Shower Transfer: Min guard;Supervision/safety;Ambulation;Rolling walker;3 in 1;With caregiver independent assisting   Functional mobility during ADLs: Min guard;Supervision/safety;Cueing for safety;Rolling walker       Vision Baseline Vision/History: Wears glasses Wears Glasses: Reading only Patient Visual Report: No change from baseline       Perception     Praxis      Pertinent Vitals/Pain Pain Assessment: 0-10 Pain Score: 5  Pain Location: R knee  Pain Descriptors / Indicators: Aching;Sore Pain Intervention(s): Premedicated before session;Monitored during session;Repositioned     Hand Dominance     Extremity/Trunk Assessment Upper Extremity Assessment Upper Extremity Assessment: Overall WFL for tasks assessed   Lower Extremity Assessment Lower Extremity Assessment: Defer to PT evaluation       Communication Communication Communication:  No difficulties   Cognition  Arousal/Alertness: Awake/alert Behavior During Therapy: WFL for tasks assessed/performed Overall Cognitive Status: Within Functional Limits for tasks assessed                                     General Comments       Exercises     Shoulder Instructions      Home Living Family/patient expects to be discharged to:: Private residence Living Arrangements: Spouse/significant other Available Help at Discharge: Family;Available 24 hours/day Type of Home: House Home Access: Stairs to enter [employer] of Steps: 3 Entrance Stairs-Rails: Right;Left Home Layout: Two level;Able to live on main level with bedroom/bathroom         Bathroom Toilet: Standard     Home Equipment: Walker - 2 wheels;Bedside commode          Prior Functioning/Environment Level of Independence: Independent with assistive device(s)        Comments: Had been using RW since surgery.         OT Problem List: Impaired balance (sitting and/or standing);Pain;Decreased activity tolerance;Decreased knowledge of use of DME or AE      OT Treatment/Interventions: Self-care/ADL training;DME and/or AE instruction;Therapeutic activities;Patient/family education;Balance training    OT Goals(Current goals can be found in the care plan section) Acute Rehab OT Goals Patient Stated Goal: to go home OT Goal Formulation: With patient/family Time For Goal Achievement: 05/12/19 Potential to Achieve Goals: Good ADL Goals Pt Will Perform Grooming: with supervision;with set-up;with modified independence;standing;with caregiver independent in assisting Pt Will Perform Lower Body Bathing: with min guard assist;with supervision;sit to/from stand;with caregiver independent in assisting Pt Will Perform Lower Body Dressing: with min guard assist;with supervision;sit to/from stand;with caregiver independent in assisting Pt Will Transfer to Toilet: with supervision;with modified  independence;ambulating Pt Will Perform Toileting - Clothing Manipulation and hygiene: with supervision;with modified independence;sit to/from stand;with caregiver independent in assisting  OT Frequency: Min 2X/week   Barriers to D/C:            Co-evaluation              AM-PAC OT "6 Clicks" Daily Activity     Outcome Measure Help from another person eating meals?: None Help from another person taking care of personal grooming?: A Little Help from another person toileting, which includes using toliet, bedpan, or urinal?: A Little Help from another person bathing (including washing, rinsing, drying)?: A Little Help from another person to put on and taking off regular upper body clothing?: None Help from another person to put on and taking off regular lower body clothing?: A Little 6 Click Score: 20   End of Session Equipment Utilized During Treatment: Gait belt;Rolling walker;Other (comment)(3 in 1)  Activity Tolerance: Patient tolerated treatment well Patient left: in chair;with call bell/phone within reach;with family/visitor present  OT Visit Diagnosis: Unsteadiness on feet (R26.81);Other abnormalities of gait and mobility (R26.89);Muscle weakness (generalized) (M62.81);Pain Pain - Right/Left: Right Pain - part of body: Knee                Time: 1029-1056 OT Time Calculation (min): 27 min Charges:  OT General Charges $OT Visit: 1 Visit OT Evaluation $OT Eval Low Complexity: 1 Low OT Treatments $Self Care/Home Management : 8-22 mins    Galen Manila 04/28/2019, 12:56 PM

## 2019-04-28 NOTE — Progress Notes (Signed)
Physical Therapy Treatment Patient Details Name: Michelle Bruce MRN: [id number] DOB: [birth year] Today's Date: 04/28/2019    History of Present Illness Pt is a 74 y/o female admitted secondary to sepsis from UTI. Pt with recent R TKA. PMH includes HTN.     PT Comments    Pt is progressing well towards goals. She increased ambulation distance today and demonstrated improved quality of gait. Pt performed exercised and was given a TKA HEP. Pt is expressing some concern about her ability to get out of the house and to OPPT at this time. Considering her current level of activity tolerance she would benefit from HHPT at d/c. D/c plans updated and discussed with evaluating PT.     Follow Up Recommendations  Supervision/Assistance - 24 hour;Home health PT     Equipment Recommendations  None recommended by PT    Recommendations for Other Services       Precautions / Restrictions Precautions Precautions: Knee;Fall Precaution Booklet Issued: No Restrictions Weight Bearing Restrictions: Yes RLE Weight Bearing: Weight bearing as tolerated    Mobility  Bed Mobility               General bed mobility comments: up in chair on arrival  Transfers Overall transfer level: Needs assistance Equipment used: Rolling walker (2 wheeled) Transfers: Sit to/from Stand Sit to Stand: Min guard Stand pivot transfers: Supervision       General transfer comment: min guard for safety to rise. Pt initially with both hands on RW but was able to correct without cues.   Ambulation/Gait Ambulation/Gait assistance: Min guard Gait Distance (Feet): 200 Feet Assistive device: Rolling walker (2 wheeled) Gait Pattern/deviations: Decreased weight shift to right;Antalgic;Step-through pattern Gait velocity: Decreased   General Gait Details: Pt with mildly antalgic gait and decreased speed. appears overall steady with RW.   Stairs             Wheelchair Mobility    Modified Rankin (Stroke  Patients Only)       Balance Overall balance assessment: Needs assistance Sitting-balance support: No upper extremity supported;Feet supported Sitting balance-Leahy Scale: Good     Standing balance support: Bilateral upper extremity supported;During functional activity Standing balance-Leahy Scale: Poor Standing balance comment: Reliant on BUE support                            Cognition Arousal/Alertness: Awake/alert Behavior During Therapy: WFL for tasks assessed/performed Overall Cognitive Status: Within Functional Limits for tasks assessed                                        Exercises Total Joint Exercises Ankle Circles/Pumps: AROM;Both;15 reps;Seated Quad Sets: AROM;Right;10 reps;Seated Heel Slides: AROM;Right;10 reps;Seated Hip ABduction/ADduction: AROM;Right;10 reps;Standing Long Arc Quad: AROM;Right;10 reps;Seated Knee Flexion: AROM;Right;10 reps;Standing Marching in Standing: AROM;Both;10 reps;Standing    General Comments General comments (skin integrity, edema, etc.): pt's husband present for session      Pertinent Vitals/Pain Pain Assessment: Faces Pain Score: 2  Pain Location: R knee  Pain Descriptors / Indicators: Aching;Sore Pain Intervention(s): Monitored during session;Limited activity within patient's tolerance;Repositioned;Patient requesting pain meds-RN notified    Home Living Family/patient expects to be discharged to:: Private residence Living Arrangements: Spouse/significant other Available Help at Discharge: Family;Available 24 hours/day Type of Home: House Home Access: Stairs to enter Entrance Stairs-Rails: Right;Left Home Layout: Two level;Able to live on  main level with bedroom/bathroom Home Equipment: Walker - 2 wheels;Bedside commode      Prior Function Level of Independence: Independent with assistive device(s)      Comments: Had been using RW since surgery.    PT Goals (current goals can now be  found in the care plan section) Acute Rehab PT Goals Patient Stated Goal: to go home PT Goal Formulation: With patient Time For Goal Achievement: 05/11/19 Potential to Achieve Goals: Good Progress towards PT goals: Progressing toward goals    Frequency    Min 3X/week      PT Plan Discharge plan needs to be updated    Co-evaluation              AM-PAC PT "6 Clicks" Mobility   Outcome Measure  Help needed turning from your back to your side while in a flat bed without using bedrails?: A Little Help needed moving from lying on your back to sitting on the side of a flat bed without using bedrails?: A Little Help needed moving to and from a bed to a chair (including a wheelchair)?: A Little Help needed standing up from a chair using your arms (e.g., wheelchair or bedside chair)?: A Little Help needed to walk in hospital room?: A Little Help needed climbing 3-5 steps with a railing? : A Lot 6 Click Score: 17    End of Session Equipment Utilized During Treatment: Gait belt Activity Tolerance: Patient tolerated treatment well Patient left: in chair;with call bell/phone within reach;with family/visitor present Nurse Communication: Mobility status;Patient requests pain meds PT Visit Diagnosis: Muscle weakness (generalized) (M62.81);Unsteadiness on feet (R26.81);Pain Pain - Right/Left: Right Pain - part of body: Knee     Time: 0258-5277 PT Time Calculation (min) (ACUTE ONLY): 43 min  Charges:  $Gait Training: 23-37 mins $Therapeutic Exercise: 8-22 mins                     Kallie Locks, [place] Pager [phone] Acute Rehab   SAISHA HOGUE 04/28/2019, 3:25 PM

## 2019-04-28 NOTE — TOC Initial Note (Signed)
Transition of Care [facility]) - Initial/Assessment Note    Patient Details  Name: Michelle Bruce MRN: [id number] Date of Birth: [birth year]  Transition of Care [facility]) CM/SW Contact:    Kingsley Plan, RN Phone Number: 04/28/2019, 4:01 PM  Clinical Narrative:                 Choice provided . [facility] unable to accept due to insurance. [facility] unable to accept. Cory with [facility] accepted.  Called for walker   Expected Discharge Plan: Home w Home Health Services Barriers to Discharge: Continued Medical Work up   Patient Goals and CMS Choice Patient states their goals for this hospitalization and ongoing recovery are:: to return to home CMS Medicare.gov Compare Post Acute Care list provided to:: Patient Choice offered to / list presented to : Patient  Expected Discharge Plan and Services Expected Discharge Plan: Home w Home Health Services In-house Referral: Clinical Social Work Discharge Planning Services: CM Consult Post Acute Care Choice: Home Health, Durable Medical Equipment Living arrangements for the past 2 months: Single Family Home                 DME Arranged: Walker rolling DME Agency: [employer] Date DME Agency Contacted: 04/28/19 Time DME Agency Contacted: 1556 Representative spoke with at DME Agency: zack HH Arranged: PT HH Agency: [facility] Date [facility] Agency Contacted: 04/28/19 Time HH Agency Contacted: [phone] Representative spoke with at [facility] Agency: Kandee Keen  Prior Living Arrangements/Services Living arrangements for the past 2 months: Single Family Home Lives with:: Spouse Patient language and need for interpreter reviewed:: Yes Do you feel safe going back to the place where you live?: Yes      Need for Family Participation in Patient Care: Yes (Comment) Care giver support system in place?: Yes (comment) Current home services: DME Criminal Activity/Legal Involvement Pertinent to Current Situation/Hospitalization: No - Comment as needed  Activities of  Daily Living Home Assistive Devices/Equipment: Dan Humphreys (specify type)    Permission Sought/Granted Permission sought to share information with : Facility Medical sales representative, Family Supports, PCP Permission granted to share information with : No  Share Information with NAME: Doristine Locks  Permission granted to share info w AGENCY: PCP  Permission granted to share info w Relationship: pt spouse  Permission granted to share info w Contact Information: [phone]  Emotional Assessment Appearance:: Appears stated age Attitude/Demeanor/Rapport: Engaged Affect (typically observed): Accepting Orientation: : Oriented to Self, Oriented to Place, Oriented to  Time, Oriented to Situation Alcohol / Substance Use: Not Applicable Psych Involvement: No (comment)  Admission diagnosis:  Biliary disease [K83.9] Fever [R50.9] Acute cystitis with hematuria [N30.01] Sepsis (HCC) [A41.9] Sepsis, due to unspecified organism, unspecified whether acute organ dysfunction present [facility]) [A41.9] Patient Active Problem List   Diagnosis Date Noted  . Infection due to ESBL-producing Escherichia coli 04/28/2019  . Sepsis (HCC) 04/26/2019  . Essential hypertension, benign 02/20/2013  . Other and unspecified hyperlipidemia 02/20/2013  . Unspecified hypothyroidism 02/20/2013  . Potassium deficiency 02/20/2013  . Right lumbar radiculopathy 01/01/2013  . Sciatica of right side without back pain 12/30/2012  . Backache 12/05/2012  . Low back pain 10/29/2012  . Bradycardia 09/16/2012  . Microcytic anemia 02/13/2011  . Takotsubo syndrome 01/18/2011  . Chest pain 01/16/2011  . Non-STEMI (non-ST elevated myocardial infarction) (HCC) 01/16/2011  . Hypertension 01/16/2011  . Hypothyroidism 01/16/2011  . Dyslipidemia 01/16/2011  . Ocular hypertension    PCP:  [name], MD Pharmacy:   [facility] [address],  [place] [address] Phone: [phone] Fax:  [place], [place] [address] Phone: [phone] Fax: [phone]  [place], [place] [address] [place] [address] Phone: [phone] Fax: [phone]     Social Determinants of Health (SDOH) Interventions    Readmission Risk Interventions Readmission Risk Prevention Plan 04/27/2019  Post Dischage Appt Complete  Medication Screening Complete  Transportation Screening Complete  Some recent data might be hidden

## 2019-05-01 LAB — CULTURE, BLOOD (ROUTINE X 2)
Culture: NO GROWTH
Culture: NO GROWTH
Special Requests: ADEQUATE
Special Requests: ADEQUATE

## 2019-05-06 ENCOUNTER — Ambulatory Visit: Payer: Medicare Other

## 2019-08-02 ENCOUNTER — Encounter (INDEPENDENT_AMBULATORY_CARE_PROVIDER_SITE_OTHER): Payer: Medicare PPO | Admitting: Ophthalmology

## 2019-10-27 NOTE — Progress Notes (Signed)
[facility] - Clinic Note  10/29/2019     CHIEF COMPLAINT Patient presents for Retina Follow Up   HISTORY OF PRESENT ILLNESS: Michelle Bruce is a 74 y.o. female who presents to the clinic today for:   HPI    Retina Follow Up    Patient presents with  Dry AMD.  In both eyes.  This started years ago.  Severity is moderate.  Duration of 9 months.  Since onset it is gradually worsening.  I, the attending physician,  performed the HPI with the patient and updated documentation appropriately.          Comments    74 y/o female pt here for 9 mo f/u for non-exu ARMD OU.  F/u lost to infection of pt's tear ducts.  Feels VA OU is worse since last visit.  Denies pain, FOL, floaters.  C/o increased glare and photophobia OU.  Timolol QAM OU, Trusopt BID OU, Travatan QHS OU.       Last edited by [name], MD on 10/29/2019  1:12 PM. (History)      Referring physician: Sallye Lat, MD [address] [place],  [place] [address]  HISTORICAL INFORMATION:   Selected notes from the MEDICAL RECORD NUMBER Referred by Dr. Ernesto Rutherford for concern of non-exu ARMD OU    CURRENT MEDICATIONS: Current Outpatient Medications (Ophthalmic Drugs)  Medication Sig  . dorzolamide (TRUSOPT) 2 % ophthalmic solution Place 1 drop into both eyes 2 (two) times daily.  . timolol (TIMOPTIC) 0.5 % ophthalmic solution Place 1 drop into both eyes daily.   . TRAVATAN Z 0.004 % SOLN ophthalmic solution Place 1 drop into both eyes at bedtime.    No current facility-administered medications for this visit. (Ophthalmic Drugs)   Current Outpatient Medications (Other)  Medication Sig  . acetaminophen (TYLENOL) 500 MG tablet Take 500 mg by mouth every 6 (six) hours as needed for mild pain, moderate pain or fever.  . benzonatate (TESSALON) 200 MG capsule TAKE 1 CAPSULE BY MOUTH THREE TIMES DAILY AS NEEDED FOR 7 DAYS  . Calcium Carb-Cholecalciferol (OYSTER SHELL CALCIUM) 500-400 MG-UNIT  TABS Calcium 500 + D 500 mg (1,250 mg)-400 unit tablet  . calcium citrate-vitamin D (CITRACAL+D) 315-200 MG-UNIT per tablet Take 1-2 tablets by mouth daily.   . chlorhexidine (PERIDEX) 0.12 % solution ml    RINSE WITH ONE CAPFUL FOR 30 SECONDS AND SPIT USE TWICE DAILY BEGIN ONE WEEK PRIOR TO SURGERY  . diclofenac Sodium (VOLTAREN) 1 % GEL diclofenac 1 % topical gel  . dicyclomine (BENTYL) 10 MG capsule   . estradiol (ESTRACE) 0.1 MG/GM vaginal cream estradiol 0.01% (0.1 mg/gram) vaginal cream   1 applicatorful twice a week by vaginal route.  Marland Kitchen estradiol (VIVELLE-DOT) 0.1 MG/24HR Place 1 patch onto the skin 2 (two) times a week. Change on Wednesday and Saturday  . famotidine (PEPCID) 40 MG tablet Bottle  . fosfomycin (MONUROL) 3 g PACK Take 1 tablet every other day starting 04/29/19  . gabapentin (NEURONTIN) 300 MG capsule Take 300 mg by mouth See admin instructions. 1 tablet by mouth 3 times daily for 2 weeks then 1 tablet by mouth twice daily for 2 weeks then 1 tablet by mouth once daily for 2 weeks.  Marland Kitchen ibuprofen (ADVIL) 200 MG tablet Take 200 mg by mouth every 6 (six) hours as needed for fever or moderate pain.  Marland Kitchen levothyroxine (SYNTHROID, LEVOTHROID) 100 MCG tablet Take 100 mcg by mouth daily before  breakfast.  . liothyronine (CYTOMEL) 5 MCG tablet Take 5 mcg by mouth daily.  . meloxicam (MOBIC) 7.5 MG tablet meloxicam 7.5 mg tablet   1 tablet every day by oral route.  . methocarbamol (ROBAXIN) 500 MG tablet Take 1 tablet by mouth 3 (three) times daily as needed for muscle spasms.  . nitrofurantoin, macrocrystal-monohydrate, (MACROBID) 100 MG capsule   . ondansetron (ZOFRAN) 4 MG tablet Take 1 tablet by mouth every 6 (six) hours as needed for nausea.  Marland Kitchen. oxyCODONE (OXY IR/ROXICODONE) 5 MG immediate release tablet Take 1 tablet by mouth every 4 (four) hours as needed for moderate pain or severe pain.   . potassium chloride (K-DUR,KLOR-CON) 10 MEQ tablet Take 2 tablets (20 mEq total) by mouth 2  (two) times daily.  . potassium chloride (KLOR-CON) 10 MEQ tablet   . PRESCRIPTION MEDICATION Take 1 tablet by mouth daily. Pink pill, oval in shape with an L and a 200  . sulfamethoxazole-trimethoprim (BACTRIM DS) 800-160 MG tablet   . telmisartan-hydrochlorothiazide (MICARDIS HCT) 80-12.5 MG tablet Take 1 tablet by mouth daily.  . traMADol (ULTRAM) 50 MG tablet Take 1-2 tablets by mouth every 6 (six) hours as needed for moderate pain.  Marland Kitchen. zolpidem (AMBIEN) 10 MG tablet Take 5 mg by mouth at bedtime.   Marland Kitchen. levothyroxine (SYNTHROID, LEVOTHROID) 75 MCG tablet Take 1 tablet (75 mcg total) by mouth daily. (Patient not taking: Reported on 04/26/2019)  . rosuvastatin (CRESTOR) 20 MG tablet Take 0.5 tablets (10 mg total) by mouth daily. (Patient taking differently: Take 20 mg by mouth daily. )   No current facility-administered medications for this visit. (Other)   REVIEW OF SYSTEMS: ROS    Positive for: Cardiovascular, Eyes   Negative for: Constitutional, Gastrointestinal, Neurological, Skin, Genitourinary, Musculoskeletal, HENT, Endocrine, Respiratory, Psychiatric, Allergic/Imm, Heme/Lymph   Last edited by Celine MansBaxley, Andrew G, COA on 10/29/2019  9:27 AM. (History)     ALLERGIES Allergies  Allergen Reactions  . Atenolol Hydrochloride Cough  . Vicodin [Hydrocodone-Acetaminophen]     Hyper    PAST MEDICAL HISTORY Past Medical History:  Diagnosis Date  . Cataract    Mixed form OU  . DDD (degenerative disc disease), lumbar   . Glaucoma   . Hyperlipemia   . Hypertension   . Hypertensive retinopathy    OU  . Hypothyroidism   . Macular degeneration    Non-exu OU  . Myocardial infarction [facility](HCC)    ? [place]  . Ocular hypertension   . Sciatica of right side without back pain 12/30/2012  . TMJ (dislocation of temporomandibular joint)    Past Surgical History:  Procedure Laterality Date  . ABDOMINAL HYSTERECTOMY    . ABDOMINAL SACROCOLPOPEXY    . APOGEE / PERIGEE REPAIR    . BREAST  BIOPSY    . DILATION AND CURETTAGE OF UTERUS    . HAND SURGERY Left 12/07/12  . LEFT HEART CATHETERIZATION WITH CORONARY ANGIOGRAM N/A 01/18/2011   Procedure: LEFT HEART CATHETERIZATION WITH CORONARY ANGIOGRAM;  Surgeon: Laurey Moralealton S McLean, MD;  [facility];  Service: Cardiovascular;  Laterality: N/A;  . TONSILLECTOMY     FAMILY HISTORY Family History  Problem Relation Age of Onset  . Coronary artery disease Mother 3080  . Hyperlipidemia Mother   . Hypertension Mother   . Prostate cancer Father   . Hypertension Father   . Hyperlipidemia Father    SOCIAL HISTORY Social History   Tobacco Use  . Smoking status: Never Smoker  . Smokeless tobacco:  Never Used  Substance Use Topics  . Alcohol use: Yes    Alcohol/week: 1.0 standard drink    Types: 1 Glasses of wine per week  . Drug use: No         OPHTHALMIC EXAM:  Base Eye Exam    Visual Acuity (Snellen - Linear)      Right Left   Dist cc 20/50 -2 20/50 -2   Dist ph cc 20/40 -2 20/40 -2   Correction: Glasses       Tonometry (Tonopen, 9:37 AM)      Right Left   Pressure 15 15       Pupils      Dark Light Shape React APD   Right 4 3 Round Brisk None   Left 4 3 Round Brisk None       Visual Fields (Counting fingers)      Left Right    Full Full       Extraocular Movement      Right Left    Full, Ortho Full, Ortho       Neuro/Psych    Oriented x3: Yes   Mood/Affect: Normal       Dilation    Both eyes: 1.0% Mydriacyl, 2.5% Phenylephrine @ 9:37 AM        Slit Lamp and Fundus Exam    Slit Lamp Exam      Right Left   Lids/Lashes Dermatochalasis - upper lid, mild Meibomian gland dysfunction Dermatochalasis - upper lid, Meibomian gland dysfunction   Conjunctiva/Sclera White and quiet White and quiet   Cornea Arcus, trace Punctate epithelial erosions Arcus, 2-3+ inferior Punctate epithelial erosions   Anterior Chamber Deep and quiet Deep and quiet   Iris Round and dilated Round and dilated   Lens 3+  Nuclear sclerosis with brunescence, 2-3+ Cortical cataract 3+ Nuclear sclerosis with brunescence, 2-3+ Cortical cataract   Vitreous Vitreous syneresis, Posterior vitreous detachment Vitreous syneresis       Fundus Exam      Right Left   Disc Sharp rim, inferior rim thinning Pink and Sharp   C/D Ratio 0.55 0.5   Macula Flat, blunted foveal reflex, scattered Drusen, PEDs, Retinal pigment epithelial mottling, No heme or edema Flat, blunted foveal reflex, Drusen, PEDs, Retinal pigment epithelial mottling, no heme or edema   Vessels Vascular attenuation, tortuosity, mild av crossing changes Vascular attenuation, tortuosity, mild av crossing changes   Periphery Attached, mild reticular degeneration, IT Retinoschisis -- stable, no heme, no RT/RD Attached, shallow IT retinoschisis, no heme, no RT/RD          IMAGING AND PROCEDURES  Imaging and Procedures for @  OCT, Retina - OU - Both Eyes       Right Eye Quality was good. Central Foveal Thickness: 307. Progression has been stable. Findings include retinal drusen , no SRF, no IRF, outer retinal atrophy, pigment epithelial detachment, abnormal foveal contour (Mild interval improvement in PEDs).   Left Eye Quality was borderline. Central Foveal Thickness: 304. Progression has been stable. Findings include normal foveal contour, no SRF, no IRF, retinal drusen , pigment epithelial detachment, outer retinal atrophy (Peristent PEDs--no significant change from prior).   Notes *Images captured and stored on drive  Diagnosis / Impression:  Non-exu ARMD OU OD: Mild interval improvement in PEDs OS: Peristent PEDs--no significant change from prior  Clinical management:  See below  Abbreviations: NFP - Normal foveal profile. CME - cystoid macular edema. PED - pigment epithelial detachment. IRF - intraretinal  fluid. SRF - subretinal fluid. EZ - ellipsoid zone. ERM - epiretinal membrane. ORA - outer retinal atrophy. ORT - outer retinal  tubulation. SRHM - subretinal hyper-reflective material                 ASSESSMENT/PLAN:    ICD-10-CM   1. Intermediate stage nonexudative age-related macular degeneration of both eyes  H35.3132   2. Retinal edema  H35.81 OCT, Retina - OU - Both Eyes  3. Essential hypertension  I10   4. Hypertensive retinopathy of both eyes  H35.033   5. Ocular hypertension, bilateral  H40.053   6. Combined forms of age-related cataract of both eyes  H25.813    1,2. Intermediate stage age related macular degeneration, non-exudative, both eyes  - OCT OD : Mild interval improvement in PEDs                         OS: Peristent PEDs--no significant change from prior   - The incidence, anatomy, and pathology of dry AMD, risk of progression, and the AREDS and AREDS 2 study including smoking risks discussed with patient.  - Recommend amsler grid monitoring  - f/u 9-12 mos, sooner prn -- DFE/OCT  3,4. Hypertensive retinopathy OU  - discussed importance of tight BP control  - monitor  5. Ocular Hypertension OU  - IOP 15 OU today  - takes Timolol QAM OU, Travatan QHS OU, Trusopt BID OU  - under the management of Dr. Dione Booze  6. Mixed form age related cataract OU  - The symptoms of cataract, surgical options, and treatments and risks were discussed with patient.  - discussed diagnosis and progression  - +visual significance  - under the expert management of Dr. Dione Booze  - was scheduled for cataract surgery in the spring of 2021, but pt had to postpone due to other health issues  - clear from a retina standpoint to proceed with cataract surgery when pt and surgeon are ready   Ophthalmic Meds Ordered this visit:  No orders of the defined types were placed in this encounter.     Return in about 9 months (around 07/28/2020) for 9-12 mo f/u for non-exu ARMD OU w/DFE&OCT.  There are no Patient Instructions on file for this visit.  This document serves as a record of services personally performed by  Karie Chimera, MD, PhD. It was created on their behalf by Glee Arvin. Manson Passey, OA an ophthalmic technician. The creation of this record is the provider's dictation and/or activities during the visit.    Electronically signed by: Glee Arvin. Manson Passey, [place] 08.18.2021 1:14 PM   This document serves as a record of services personally performed by Karie Chimera, MD, PhD. It was created on their behalf by Cristopher Estimable, COT an ophthalmic technician. The creation of this record is the provider's dictation and/or activities during the visit.    Electronically signed by: Cristopher Estimable, COT 8.20.21 @ 1:14 PM  Karie Chimera, M.D., Ph.D. Diseases & Surgery of the Retina and Vitreous [facility] 8.20.21  I have reviewed the above documentation for accuracy and completeness, and I agree with the above. Karie Chimera, M.D., Ph.D. 10/29/19 1:14 PM    Abbreviations: M myopia (nearsighted); A astigmatism; H hyperopia (farsighted); P presbyopia; Mrx spectacle prescription;  CTL contact lenses; OD right eye; OS left eye; OU both eyes  XT exotropia; ET esotropia; PEK punctate epithelial keratitis; PEE punctate epithelial erosions; DES dry eye  syndrome; MGD meibomian gland dysfunction; ATs artificial tears; PFAT's preservative free artificial tears; NSC nuclear sclerotic cataract; PSC posterior subcapsular cataract; ERM epi-retinal membrane; PVD posterior vitreous detachment; RD retinal detachment; DM diabetes mellitus; DR diabetic retinopathy; NPDR non-proliferative diabetic retinopathy; PDR proliferative diabetic retinopathy; CSME clinically significant macular edema; DME diabetic macular edema; dbh dot blot hemorrhages; CWS cotton wool spot; POAG primary open angle glaucoma; C/D cup-to-disc ratio; HVF humphrey visual field; GVF goldmann visual field; OCT optical coherence tomography; IOP intraocular pressure; BRVO Branch retinal vein occlusion; CRVO central retinal vein occlusion; CRAO central  retinal artery occlusion; BRAO branch retinal artery occlusion; RT retinal tear; SB scleral buckle; PPV pars plana vitrectomy; VH Vitreous hemorrhage; PRP panretinal laser photocoagulation; IVK intravitreal kenalog; VMT vitreomacular traction; MH Macular hole;  NVD neovascularization of the disc; NVE neovascularization elsewhere; AREDS age related eye disease study; ARMD age related macular degeneration; POAG primary open angle glaucoma; EBMD epithelial/anterior basement membrane dystrophy; ACIOL anterior chamber intraocular lens; IOL intraocular lens; PCIOL posterior chamber intraocular lens; Phaco/IOL phacoemulsification with intraocular lens placement; PRK photorefractive keratectomy; LASIK laser assisted in situ keratomileusis; HTN hypertension; DM diabetes mellitus; COPD chronic obstructive pulmonary disease

## 2019-10-29 ENCOUNTER — Encounter (INDEPENDENT_AMBULATORY_CARE_PROVIDER_SITE_OTHER): Payer: Self-pay | Admitting: Ophthalmology

## 2019-10-29 ENCOUNTER — Ambulatory Visit (INDEPENDENT_AMBULATORY_CARE_PROVIDER_SITE_OTHER): Payer: Medicare PPO | Admitting: Ophthalmology

## 2019-10-29 ENCOUNTER — Other Ambulatory Visit: Payer: Self-pay

## 2019-10-29 DIAGNOSIS — I10 Essential (primary) hypertension: Secondary | ICD-10-CM

## 2019-10-29 DIAGNOSIS — H35033 Hypertensive retinopathy, bilateral: Secondary | ICD-10-CM

## 2019-10-29 DIAGNOSIS — H3581 Retinal edema: Secondary | ICD-10-CM | POA: Diagnosis not present

## 2019-10-29 DIAGNOSIS — H40053 Ocular hypertension, bilateral: Secondary | ICD-10-CM

## 2019-10-29 DIAGNOSIS — H353132 Nonexudative age-related macular degeneration, bilateral, intermediate dry stage: Secondary | ICD-10-CM | POA: Diagnosis not present

## 2019-10-29 DIAGNOSIS — H25813 Combined forms of age-related cataract, bilateral: Secondary | ICD-10-CM

## 2020-09-05 ENCOUNTER — Encounter (INDEPENDENT_AMBULATORY_CARE_PROVIDER_SITE_OTHER): Payer: Medicare PPO | Admitting: Ophthalmology

## 2020-10-02 ENCOUNTER — Encounter (INDEPENDENT_AMBULATORY_CARE_PROVIDER_SITE_OTHER): Payer: Medicare PPO | Admitting: Ophthalmology

## 2020-10-03 NOTE — Progress Notes (Signed)
[facility] - Clinic Note  10/05/2020     CHIEF COMPLAINT Patient presents for Retina Follow Up   HISTORY OF PRESENT ILLNESS: Michelle Bruce is a 75 y.o. female who presents to the clinic today for:   HPI     Retina Follow Up   Patient presents with  Dry AMD.  In both eyes.  This started 11 months ago.  I, the attending physician,  performed the HPI with the patient and updated documentation appropriately.        Comments   Patient here for 11 month retina follow up for non-exu ARMD OU. Patient states vision doing pretty good. No eye pain. Had cataract surgery done OU December 2021 by Dr Dione Booze. Had Knee replace Right Jan 2022.       Last edited by [name], MD on 10/06/2020  9:28 PM.    No change in [place] OU.  Pt has had cat sx OU and tear duct sx since last visit.  Referring physician: Ernesto Rutherford, MD [address] [place],  [place] [address]  HISTORICAL INFORMATION:   Selected notes from the MEDICAL RECORD NUMBER Referred by Dr. Ernesto Rutherford for concern of non-exu ARMD OU   CURRENT MEDICATIONS: Current Outpatient Medications (Ophthalmic Drugs)  Medication Sig   dorzolamide (TRUSOPT) 2 % ophthalmic solution Place 1 drop into both eyes 2 (two) times daily.   timolol (TIMOPTIC) 0.5 % ophthalmic solution Place 1 drop into both eyes daily.    TRAVATAN Z 0.004 % SOLN ophthalmic solution Place 1 drop into both eyes at bedtime.    No current facility-administered medications for this visit. (Ophthalmic Drugs)   Current Outpatient Medications (Other)  Medication Sig   acetaminophen (TYLENOL) 500 MG tablet Take 500 mg by mouth every 6 (six) hours as needed for mild pain, moderate pain or fever.   benzonatate (TESSALON) 200 MG capsule TAKE 1 CAPSULE BY MOUTH THREE TIMES DAILY AS NEEDED FOR 7 DAYS   Calcium Carb-Cholecalciferol (OYSTER SHELL CALCIUM) 500-400 MG-UNIT TABS Calcium 500 + D 500 mg (1,250 mg)-400 unit tablet   calcium citrate-vitamin D  (CITRACAL+D) 315-200 MG-UNIT per tablet Take 1-2 tablets by mouth daily.    chlorhexidine (PERIDEX) 0.12 % solution ml    RINSE WITH ONE CAPFUL FOR 30 SECONDS AND SPIT USE TWICE DAILY BEGIN ONE WEEK PRIOR TO SURGERY   diclofenac Sodium (VOLTAREN) 1 % GEL diclofenac 1 % topical gel   dicyclomine (BENTYL) 10 MG capsule    estradiol (ESTRACE) 0.1 MG/GM vaginal cream estradiol 0.01% (0.1 mg/gram) vaginal cream   1 applicatorful twice a week by vaginal route.   estradiol (VIVELLE-DOT) 0.1 MG/24HR Place 1 patch onto the skin 2 (two) times a week. Change on Wednesday and Saturday   famotidine (PEPCID) 40 MG tablet Bottle   fosfomycin (MONUROL) 3 g PACK Take 1 tablet every other day starting 04/29/19   gabapentin (NEURONTIN) 300 MG capsule Take 300 mg by mouth See admin instructions. 1 tablet by mouth 3 times daily for 2 weeks then 1 tablet by mouth twice daily for 2 weeks then 1 tablet by mouth once daily for 2 weeks.   ibuprofen (ADVIL) 200 MG tablet Take 200 mg by mouth every 6 (six) hours as needed for fever or moderate pain.   levothyroxine (SYNTHROID, LEVOTHROID) 100 MCG tablet Take 100 mcg by mouth daily before breakfast.   levothyroxine (SYNTHROID, LEVOTHROID) 75 MCG tablet Take 1 tablet (75 mcg total) by mouth daily. (  Patient not taking: Reported on 04/26/2019)   liothyronine (CYTOMEL) 5 MCG tablet Take 5 mcg by mouth daily.   meloxicam (MOBIC) 7.5 MG tablet meloxicam 7.5 mg tablet   1 tablet every day by oral route.   methocarbamol (ROBAXIN) 500 MG tablet Take 1 tablet by mouth 3 (three) times daily as needed for muscle spasms.   nitrofurantoin, macrocrystal-monohydrate, (MACROBID) 100 MG capsule    ondansetron (ZOFRAN) 4 MG tablet Take 1 tablet by mouth every 6 (six) hours as needed for nausea.   oxyCODONE (OXY IR/ROXICODONE) 5 MG immediate release tablet Take 1 tablet by mouth every 4 (four) hours as needed for moderate pain or severe pain.    potassium chloride (K-DUR,KLOR-CON) 10 MEQ tablet  Take 2 tablets (20 mEq total) by mouth 2 (two) times daily.   potassium chloride (KLOR-CON) 10 MEQ tablet    PRESCRIPTION MEDICATION Take 1 tablet by mouth daily. Pink pill, oval in shape with an L and a 200   rosuvastatin (CRESTOR) 20 MG tablet Take 0.5 tablets (10 mg total) by mouth daily. (Patient taking differently: Take 20 mg by mouth daily. )   sulfamethoxazole-trimethoprim (BACTRIM DS) 800-160 MG tablet    telmisartan-hydrochlorothiazide (MICARDIS HCT) 80-12.5 MG tablet Take 1 tablet by mouth daily.   traMADol (ULTRAM) 50 MG tablet Take 1-2 tablets by mouth every 6 (six) hours as needed for moderate pain.   zolpidem (AMBIEN) 10 MG tablet Take 5 mg by mouth at bedtime.    No current facility-administered medications for this visit. (Other)   REVIEW OF SYSTEMS: ROS   Positive for: Gastrointestinal, Musculoskeletal, Cardiovascular, Eyes Negative for: Constitutional, Neurological, Skin, HENT, Endocrine, Respiratory, Psychiatric, Allergic/Imm, Heme/Lymph Last edited by Laddie Aquas, COA on 10/05/2020 10:02 AM.      ALLERGIES Allergies  Allergen Reactions   Atenolol Hydrochloride Cough   Vicodin [Hydrocodone-Acetaminophen]     Hyper    PAST MEDICAL HISTORY Past Medical History:  Diagnosis Date   Cataract    Mixed form OU   DDD (degenerative disc disease), lumbar    Glaucoma    Hyperlipemia    Hypertension    Hypertensive retinopathy    OU   Hypothyroidism    Macular degeneration    Non-exu OU   Myocardial infarction [facility])    ? [place]otsubo   Ocular hypertension    Sciatica of right side without back pain 12/30/2012   TMJ (dislocation of temporomandibular joint)    Past Surgical History:  Procedure Laterality Date   ABDOMINAL HYSTERECTOMY     ABDOMINAL SACROCOLPOPEXY     APOGEE / PERIGEE REPAIR     BREAST BIOPSY     DILATION AND CURETTAGE OF UTERUS     HAND SURGERY Left 12/07/12   LEFT HEART CATHETERIZATION WITH CORONARY ANGIOGRAM N/A 01/18/2011   Procedure:  LEFT HEART CATHETERIZATION WITH CORONARY ANGIOGRAM;  Surgeon: Laurey Morale, MD;  Location: [facility] CATH LAB;  Service: Cardiovascular;  Laterality: N/A;   TONSILLECTOMY     FAMILY HISTORY Family History  Problem Relation Age of Onset   Coronary artery disease Mother 65   Hyperlipidemia Mother    Hypertension Mother    Prostate cancer Father    Hypertension Father    Hyperlipidemia Father    SOCIAL HISTORY Social History   Tobacco Use   Smoking status: Never   Smokeless tobacco: Never  Substance Use Topics   Alcohol use: Yes    Alcohol/week: 1.0 standard drink    Types: 1 Glasses of wine per  week   Drug use: No         OPHTHALMIC EXAM:  Base Eye Exam     Visual Acuity (Snellen - Linear)       Right Left   Dist cc 20/30 -2 20/30 -2   Dist ph cc NI NI    Correction: Glasses  Patient wanted vision tested without glasses also. OD 30-2 PH NI  OS 30 PH 25        Tonometry (Tonopen, 9:50 AM)       Right Left   Pressure 10 14         Pupils       Dark Light Shape React APD   Right 4 3 Round Brisk None   Left 4 3 Round Brisk None         Visual Fields (Counting fingers)       Left Right    Full Full         Extraocular Movement       Right Left    Full, Ortho Full, Ortho         Neuro/Psych     Oriented x3: Yes   Mood/Affect: Normal         Dilation     Both eyes: 1.0% Mydriacyl, 2.5% Phenylephrine @ 9:50 AM           Slit Lamp and Fundus Exam     Slit Lamp Exam       Right Left   Lids/Lashes Dermatochalasis - upper lid, mild Meibomian gland dysfunction Dermatochalasis - upper lid, Meibomian gland dysfunction   Conjunctiva/Sclera White and quiet White and quiet   Cornea Arcus, 1+ Punctate epithelial erosions inferiorly, well healed temporal cataract wound Arcus, 1-2+ inferior Punctate epithelial erosions, well healed temporal cataract wound   Anterior Chamber Deep and quiet Deep and quiet   Iris Round and dilated Round and  dilated   Lens PCIOL in excellent position; trace PCO PCIOL in excellent position   Vitreous Vitreous syneresis, Posterior vitreous detachment Vitreous syneresis, mild condensations         Fundus Exam       Right Left   Disc Sharp rim, inferior rim thinning Pink and Sharp   C/D Ratio 0.6 0.5   Macula Flat, blunted foveal reflex, scattered Drusen, PEDs, Retinal pigment epithelial mottling, No heme or edema Flat, blunted foveal reflex, Drusen, PEDs, Retinal pigment epithelial mottling, no heme or edema   Vessels Vascular attenuation, tortuosity, mild av crossing changes Vascular attenuation, tortuosity, mild av crossing changes   Periphery Attached, mild reticular degeneration, IT Retinoschisis -- stable, no heme, no RT/RD Attached, shallow IT retinoschisis, no heme, no RT/RD           Refraction     Wearing Rx       Sphere Cylinder Axis Add   Right +0.00 +0.75 004 +1.75   Left +0.00 +0.75 117 +1.75            IMAGING AND PROCEDURES  Imaging and Procedures for @  OCT, Retina - OU - Both Eyes       Right Eye Quality was good. Central Foveal Thickness: 312. Progression has been stable. Findings include retinal drusen , no SRF, no IRF, outer retinal atrophy, pigment epithelial detachment, abnormal foveal contour (Stable PEDS ?isolated increase in focal PED temporal to fovea).   Left Eye Quality was borderline. Central Foveal Thickness: 308. Progression has been stable. Findings include normal foveal contour, no SRF, no IRF,  retinal drusen , pigment epithelial detachment, outer retinal atrophy (Peristent PEDs--no significant change from prior).   Notes *Images captured and stored on drive  Diagnosis / Impression:  Non-exu ARMD OU OD: Stable PEDS ?isolated increase in focal PED temporal to fovea OS: Peristent PEDs--no significant change from prior  Clinical management:  See below  Abbreviations: NFP - Normal foveal profile. CME - cystoid macular edema. PED -  pigment epithelial detachment. IRF - intraretinal fluid. SRF - subretinal fluid. EZ - ellipsoid zone. ERM - epiretinal membrane. ORA - outer retinal atrophy. ORT - outer retinal tubulation. SRHM - subretinal hyper-reflective material            ASSESSMENT/PLAN:    ICD-10-CM   1. Intermediate stage nonexudative age-related macular degeneration of both eyes  H35.3132     2. Retinal edema  H35.81 OCT, Retina - OU - Both Eyes    3. Essential hypertension  I10     4. Hypertensive retinopathy of both eyes  H35.033     5. Ocular hypertension, bilateral  H40.053     6. Pseudophakia of both eyes  Z96.1      1,2. Intermediate stage age related macular degeneration, non-exudative, both eyes  - OCT OD : Stable PEDS ?isolated increase in focal PED temporal to fovea; OS: Peristent PEDs--no significant change from prior   - The incidence, anatomy, and pathology of dry AMD, risk of progression, and the AREDS and AREDS 2 study including smoking risks discussed with patient.   - Recommend amsler grid monitoring  - f/u 6-9 mos, sooner prn -- DFE/OCT  3,4. Hypertensive retinopathy OU  - discussed importance of tight BP control  - monitor   5. Ocular Hypertension OU  - IOP 10, 14 today  - takes Timolol QAM OU, Travatan QHS OU, Trusopt BID OU  - under the management of Dr. Dione Booze  6. Pseudophakia OU  - s/p CE/IOL OU, Dr. Dione Booze  - IOLs in good position, doing well  - monitor   Ophthalmic Meds Ordered this visit:  No orders of the defined types were placed in this encounter.     Return for 6-9 mo f/u w/DFE&OCT.  There are no Patient Instructions on file for this visit.  This document serves as a record of services personally performed by Karie Chimera, MD, PhD. It was created on their behalf by Joni Reining, an ophthalmic technician. The creation of this record is the provider's dictation and/or activities during the visit.    Electronically signed by: Joni Reining COA, 10/06/20   9:31 PM  This document serves as a record of services personally performed by Karie Chimera, MD, PhD. It was created on their behalf by Cristopher Estimable, COT an ophthalmic technician. The creation of this record is the provider's dictation and/or activities during the visit.    Electronically signed by: Cristopher Estimable, COT 7.28.22 @ 9:31 PM   Karie Chimera, M.D., Ph.D. Diseases & Surgery of the Retina and Vitreous [facility] 10/05/2020  I have reviewed the above documentation for accuracy and completeness, and I agree with the above. Karie Chimera, M.D., Ph.D. 10/06/20 9:31 PM   Abbreviations: M myopia (nearsighted); A astigmatism; H hyperopia (farsighted); P presbyopia; Mrx spectacle prescription;  CTL contact lenses; OD right eye; OS left eye; OU both eyes  XT exotropia; ET esotropia; PEK punctate epithelial keratitis; PEE punctate epithelial erosions; DES dry eye syndrome; MGD meibomian gland dysfunction; ATs artificial tears; PFAT's preservative free artificial  tears; NSC nuclear sclerotic cataract; PSC posterior subcapsular cataract; ERM epi-retinal membrane; PVD posterior vitreous detachment; RD retinal detachment; DM diabetes mellitus; DR diabetic retinopathy; NPDR non-proliferative diabetic retinopathy; PDR proliferative diabetic retinopathy; CSME clinically significant macular edema; DME diabetic macular edema; dbh dot blot hemorrhages; CWS cotton wool spot; POAG primary open angle glaucoma; C/D cup-to-disc ratio; HVF humphrey visual field; GVF goldmann visual field; OCT optical coherence tomography; IOP intraocular pressure; BRVO Branch retinal vein occlusion; CRVO central retinal vein occlusion; CRAO central retinal artery occlusion; BRAO branch retinal artery occlusion; RT retinal tear; SB scleral buckle; PPV pars plana vitrectomy; VH Vitreous hemorrhage; PRP panretinal laser photocoagulation; IVK intravitreal kenalog; VMT vitreomacular traction; MH Macular hole;   NVD neovascularization of the disc; NVE neovascularization elsewhere; AREDS age related eye disease study; ARMD age related macular degeneration; POAG primary open angle glaucoma; EBMD epithelial/anterior basement membrane dystrophy; ACIOL anterior chamber intraocular lens; IOL intraocular lens; PCIOL posterior chamber intraocular lens; Phaco/IOL phacoemulsification with intraocular lens placement; PRK photorefractive keratectomy; LASIK laser assisted in situ keratomileusis; HTN hypertension; DM diabetes mellitus; COPD chronic obstructive pulmonary disease

## 2020-10-05 ENCOUNTER — Encounter (INDEPENDENT_AMBULATORY_CARE_PROVIDER_SITE_OTHER): Payer: Self-pay | Admitting: Ophthalmology

## 2020-10-05 ENCOUNTER — Other Ambulatory Visit: Payer: Self-pay

## 2020-10-05 ENCOUNTER — Ambulatory Visit (INDEPENDENT_AMBULATORY_CARE_PROVIDER_SITE_OTHER): Payer: Medicare PPO | Admitting: Ophthalmology

## 2020-10-05 DIAGNOSIS — I10 Essential (primary) hypertension: Secondary | ICD-10-CM | POA: Diagnosis not present

## 2020-10-05 DIAGNOSIS — H35033 Hypertensive retinopathy, bilateral: Secondary | ICD-10-CM

## 2020-10-05 DIAGNOSIS — H25813 Combined forms of age-related cataract, bilateral: Secondary | ICD-10-CM

## 2020-10-05 DIAGNOSIS — Z961 Presence of intraocular lens: Secondary | ICD-10-CM

## 2020-10-05 DIAGNOSIS — H353132 Nonexudative age-related macular degeneration, bilateral, intermediate dry stage: Secondary | ICD-10-CM

## 2020-10-05 DIAGNOSIS — H3581 Retinal edema: Secondary | ICD-10-CM

## 2020-10-05 DIAGNOSIS — H40053 Ocular hypertension, bilateral: Secondary | ICD-10-CM

## 2020-10-06 ENCOUNTER — Encounter (INDEPENDENT_AMBULATORY_CARE_PROVIDER_SITE_OTHER): Payer: Self-pay | Admitting: Ophthalmology

## 2020-11-17 IMAGING — DX DG CHEST 1V PORT
1 series · 1 of 1 positions shown · non-contrast
Comparison: 11/30/2012

CLINICAL DATA: Sepsis, confusion, fever

EXAM:
PORTABLE CHEST 1 VIEW

[chest ap]
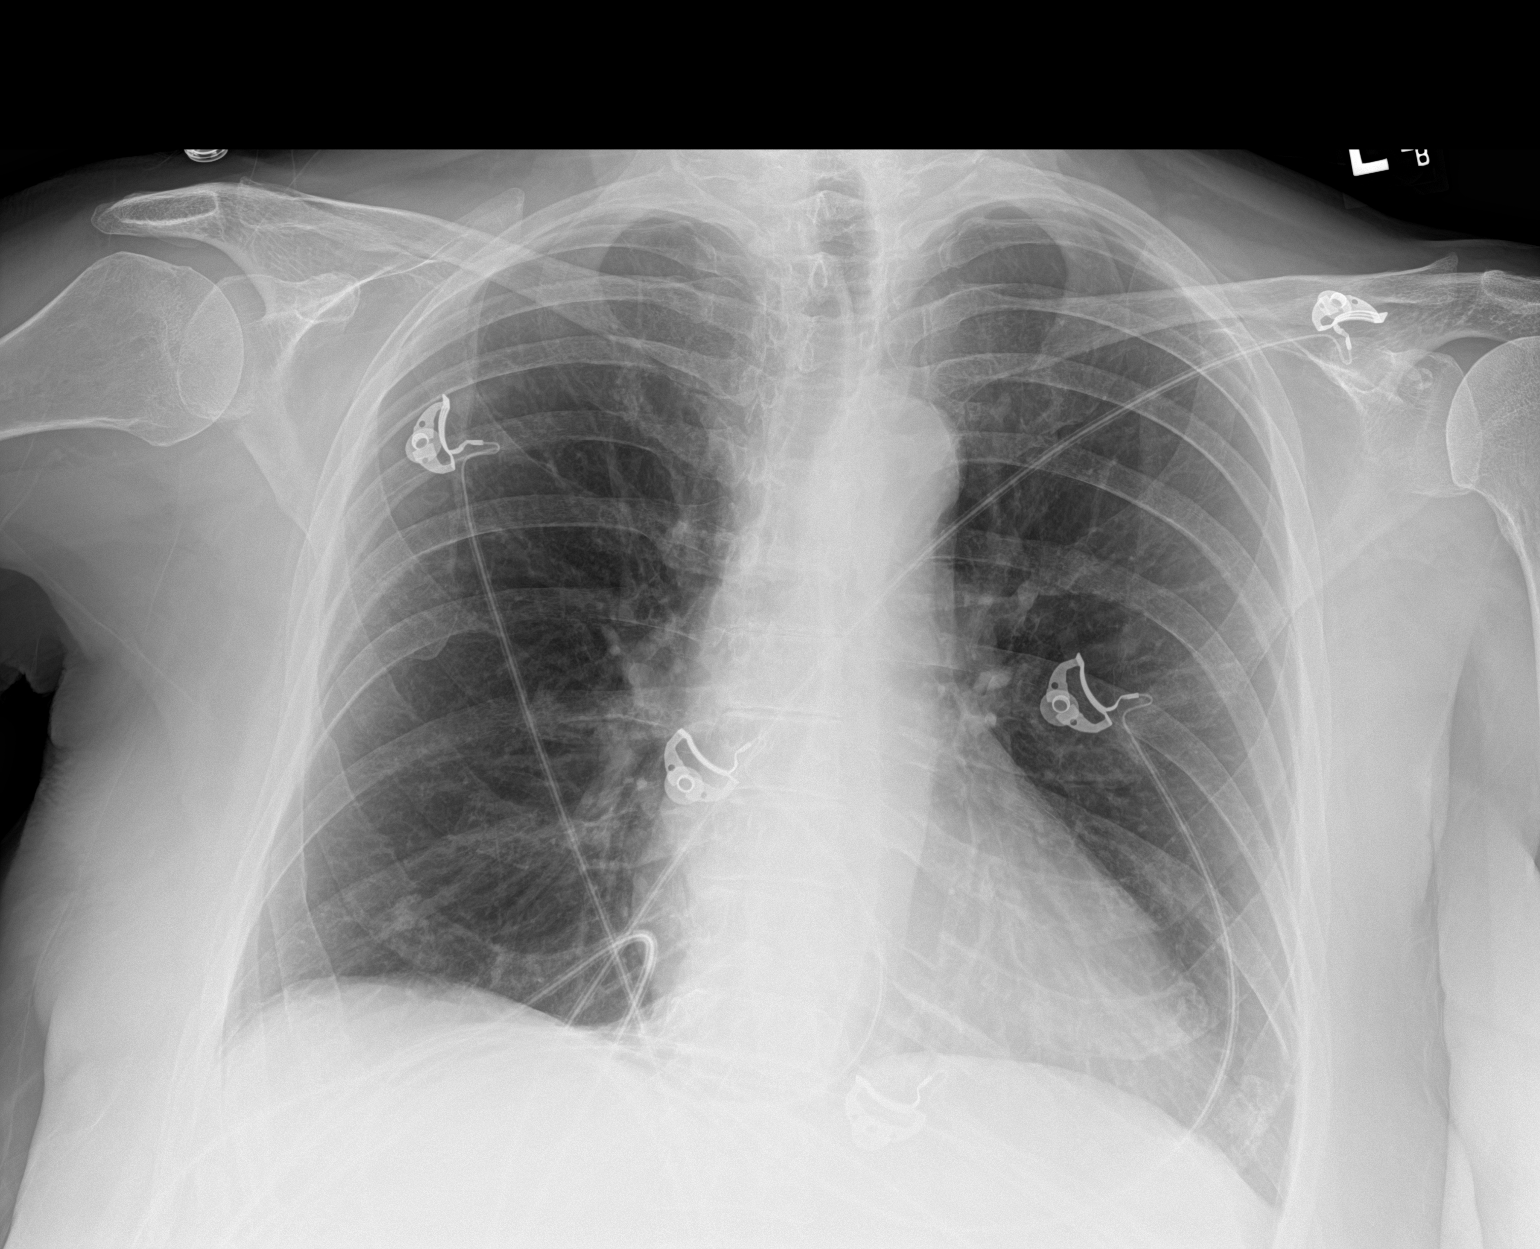

[1 of 1 positions shown; findings below may reference images not displayed]

FINDINGS: The heart size and mediastinal contours are within normal limits.
Both lungs are clear. The visualized skeletal structures are
unremarkable.
IMPRESSION: No active disease.

## 2020-11-17 IMAGING — US US ABDOMEN LIMITED
1 series · 14 of 25 positions shown · non-contrast
Comparison: None.

CT 01/16/2011

CLINICAL DATA: Possible biliary disease, sepsis confusion

EXAM:
ULTRASOUND ABDOMEN LIMITED RIGHT UPPER QUADRANT

[Series 1: us abdomen limited · 14 of 33 slices shown]
[im 1/33]
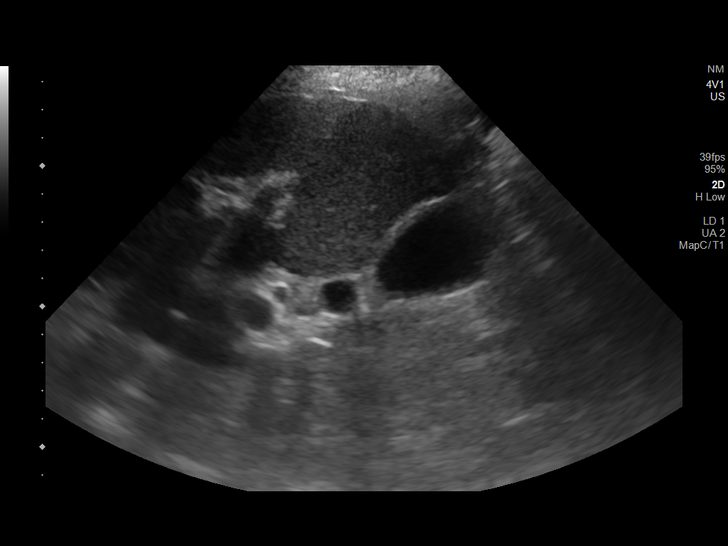
[im 3/33]
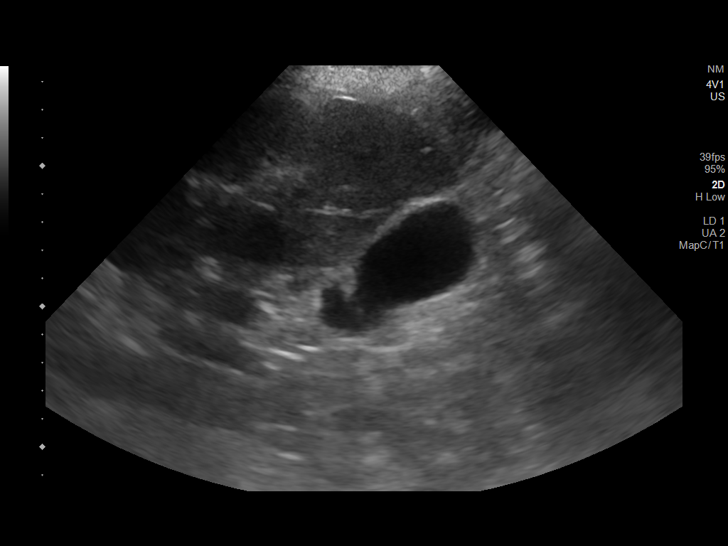
[im 6/33]
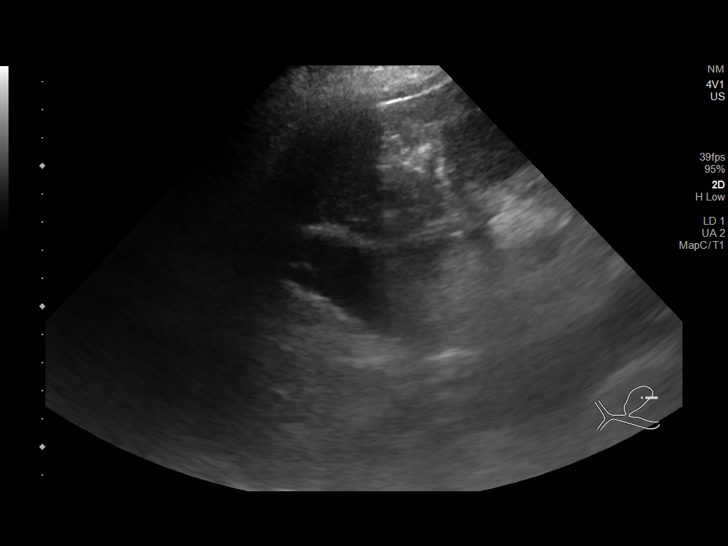
[im 9/33]
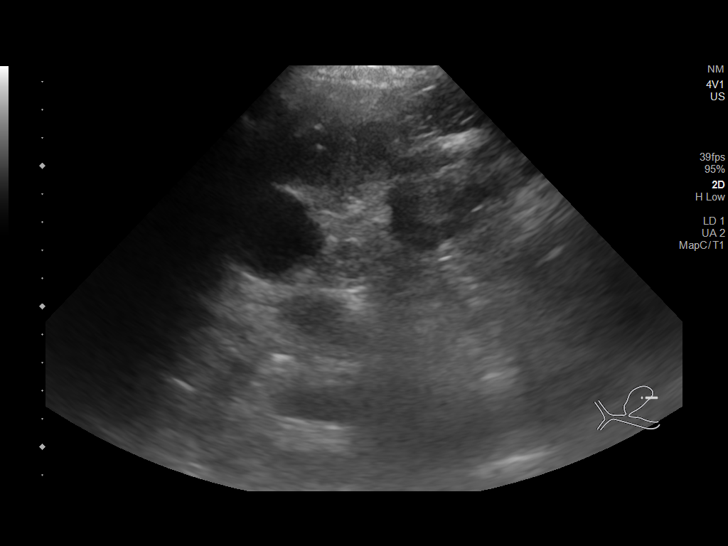
[im 11/33]
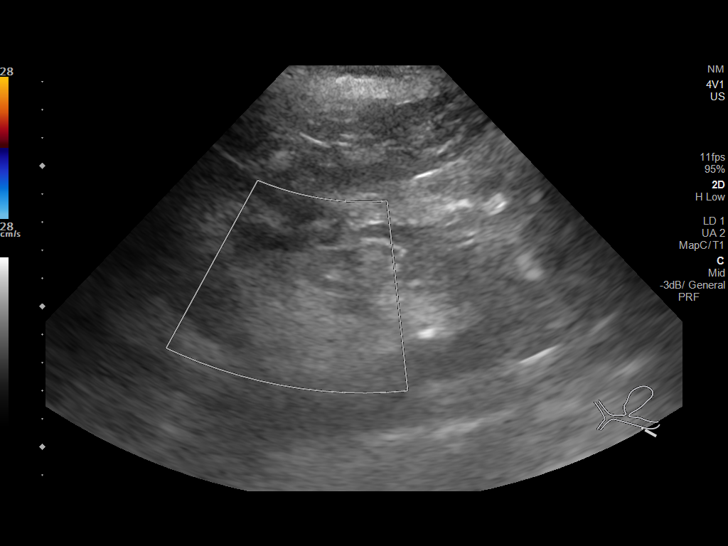
[im 13/33]
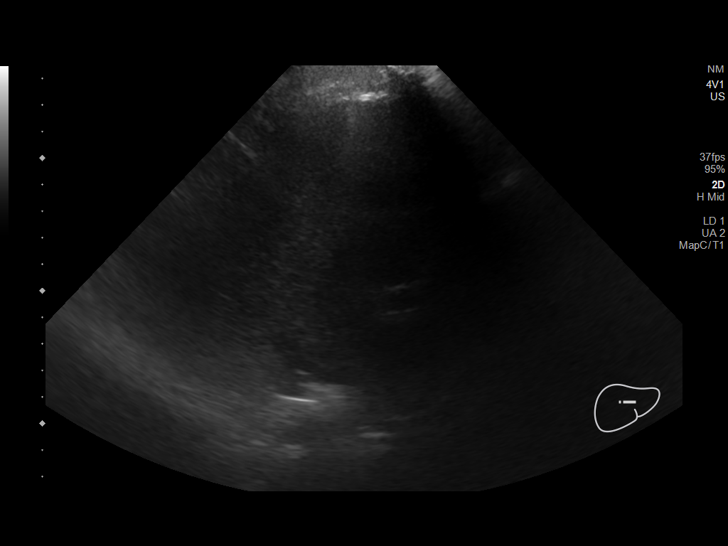
[im 15/33]
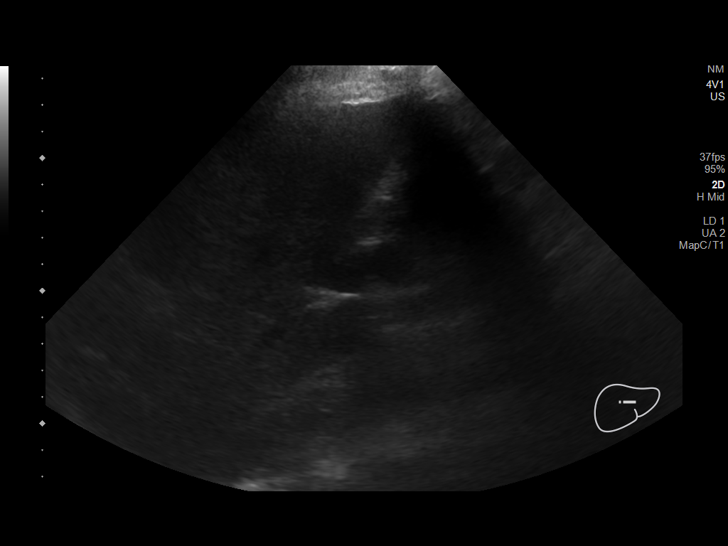
[im 18/33]
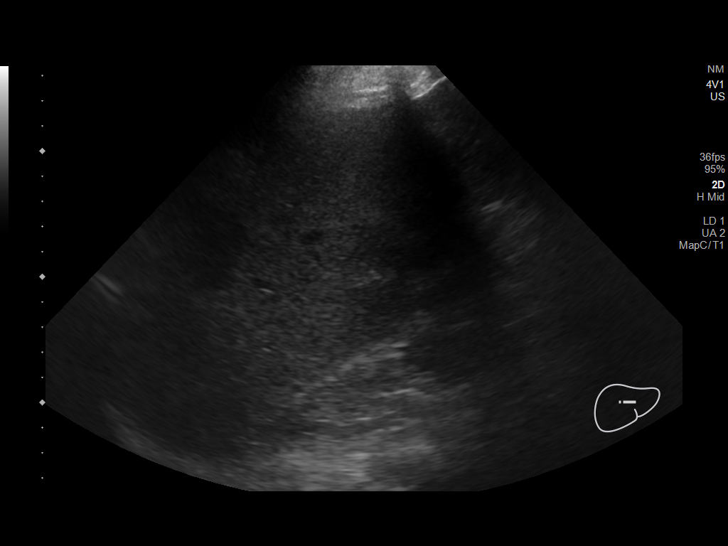
[im 21/33]
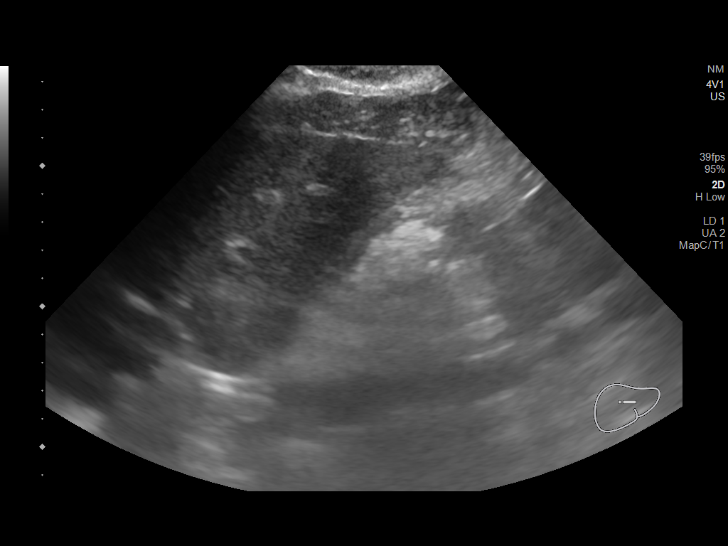
[im 22/33]
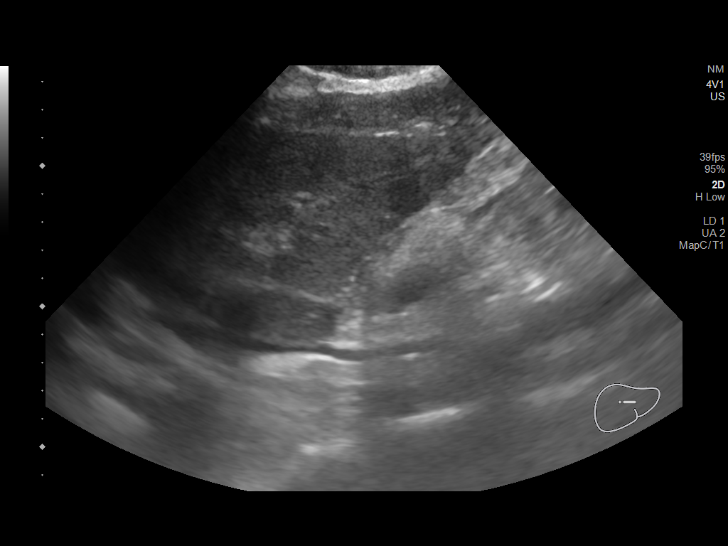
[im 25/33]
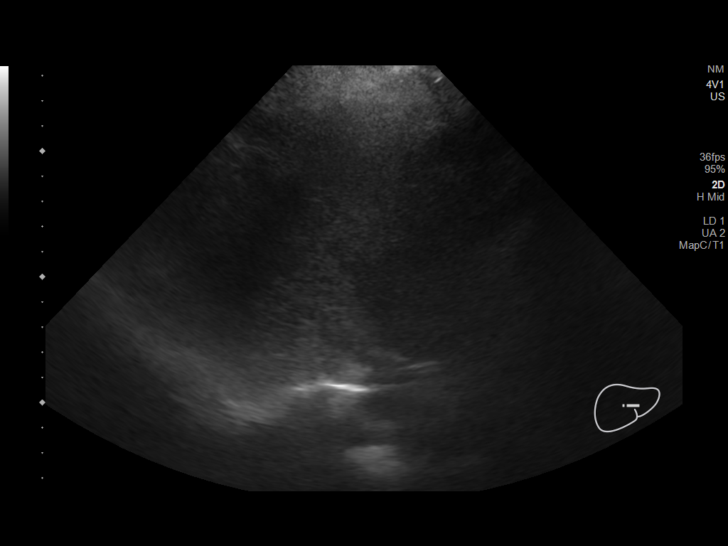
[im 27/33]
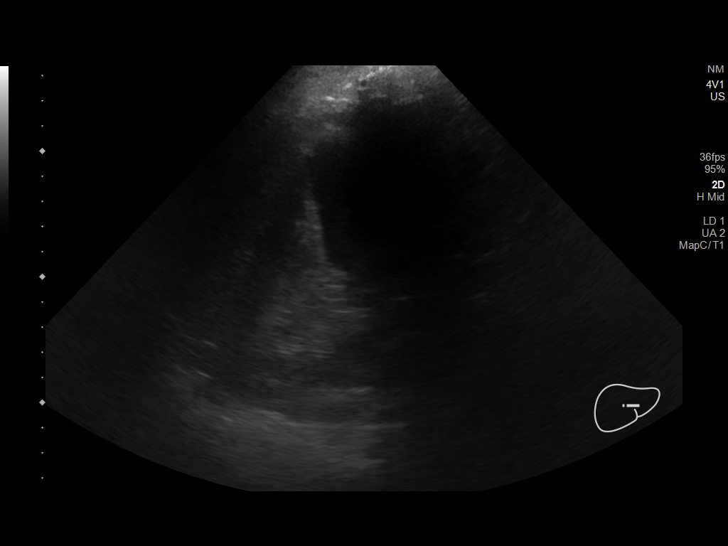
[im 30/33]
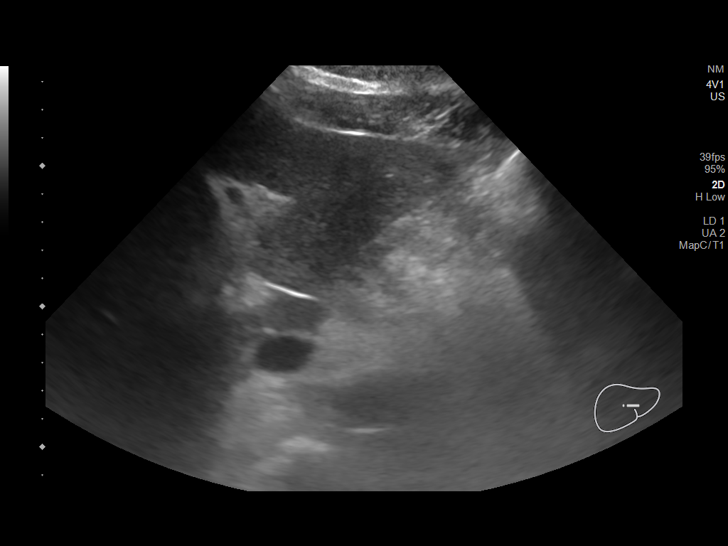
[im 33/33]
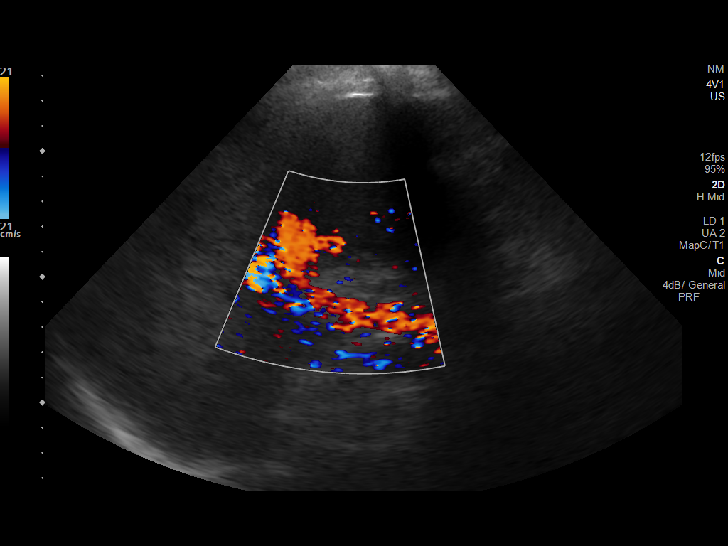

[14 of 25 positions shown; findings below may reference images not displayed]

FINDINGS: Gallbladder:

No shadowing stone. Borderline increased wall thickness. No
sonographic Murphy indicated

Common bile duct:

Diameter: 2.6 mm

Liver:

Liver may be slightly echogenic. No focal hepatic abnormality portal
vein is patent on color Doppler imaging with normal direction of
blood flow towards the liver.

Other: None.
IMPRESSION: 1. Negative for gallstones or biliary dilatation
2. Possible slightly echogenic liver as may be seen with mild
steatosis

## 2021-04-09 NOTE — Progress Notes (Shared)
[facility] - Clinic Note  04/11/2021     CHIEF COMPLAINT Patient presents for No chief complaint on file.    HISTORY OF PRESENT ILLNESS: Michelle Bruce is a 76 y.o. female who presents to the clinic today for:     Referring physician: [name], MD [address] [place],  [place] [address]  HISTORICAL INFORMATION:   Selected notes from the MEDICAL RECORD NUMBER Referred by Dr. Ernesto Rutherford for concern of non-exu ARMD OU   CURRENT MEDICATIONS: Current Outpatient Medications (Ophthalmic Drugs)  Medication Sig   dorzolamide (TRUSOPT) 2 % ophthalmic solution Place 1 drop into both eyes 2 (two) times daily.   timolol (TIMOPTIC) 0.5 % ophthalmic solution Place 1 drop into both eyes daily.    TRAVATAN Z 0.004 % SOLN ophthalmic solution Place 1 drop into both eyes at bedtime.    No current facility-administered medications for this visit. (Ophthalmic Drugs)   Current Outpatient Medications (Other)  Medication Sig   acetaminophen (TYLENOL) 500 MG tablet Take 500 mg by mouth every 6 (six) hours as needed for mild pain, moderate pain or fever.   benzonatate (TESSALON) 200 MG capsule TAKE 1 CAPSULE BY MOUTH THREE TIMES DAILY AS NEEDED FOR 7 DAYS   Calcium Carb-Cholecalciferol (OYSTER SHELL CALCIUM) 500-400 MG-UNIT TABS Calcium 500 + D 500 mg (1,250 mg)-400 unit tablet   calcium citrate-vitamin D (CITRACAL+D) 315-200 MG-UNIT per tablet Take 1-2 tablets by mouth daily.    chlorhexidine (PERIDEX) 0.12 % solution ml    RINSE WITH ONE CAPFUL FOR 30 SECONDS AND SPIT USE TWICE DAILY BEGIN ONE WEEK PRIOR TO SURGERY   diclofenac Sodium (VOLTAREN) 1 % GEL diclofenac 1 % topical gel   dicyclomine (BENTYL) 10 MG capsule    estradiol (ESTRACE) 0.1 MG/GM vaginal cream estradiol 0.01% (0.1 mg/gram) vaginal cream   1 applicatorful twice a week by vaginal route.   estradiol (VIVELLE-DOT) 0.1 MG/24HR Place 1 patch onto the skin 2 (two) times a week. Change on Wednesday  and Saturday   famotidine (PEPCID) 40 MG tablet Bottle   fosfomycin (MONUROL) 3 g PACK Take 1 tablet every other day starting 04/29/19   gabapentin (NEURONTIN) 300 MG capsule Take 300 mg by mouth See admin instructions. 1 tablet by mouth 3 times daily for 2 weeks then 1 tablet by mouth twice daily for 2 weeks then 1 tablet by mouth once daily for 2 weeks.   ibuprofen (ADVIL) 200 MG tablet Take 200 mg by mouth every 6 (six) hours as needed for fever or moderate pain.   levothyroxine (SYNTHROID, LEVOTHROID) 100 MCG tablet Take 100 mcg by mouth daily before breakfast.   levothyroxine (SYNTHROID, LEVOTHROID) 75 MCG tablet Take 1 tablet (75 mcg total) by mouth daily. (Patient not taking: Reported on 04/26/2019)   liothyronine (CYTOMEL) 5 MCG tablet Take 5 mcg by mouth daily.   meloxicam (MOBIC) 7.5 MG tablet meloxicam 7.5 mg tablet   1 tablet every day by oral route.   methocarbamol (ROBAXIN) 500 MG tablet Take 1 tablet by mouth 3 (three) times daily as needed for muscle spasms.   nitrofurantoin, macrocrystal-monohydrate, (MACROBID) 100 MG capsule    ondansetron (ZOFRAN) 4 MG tablet Take 1 tablet by mouth every 6 (six) hours as needed for nausea.   oxyCODONE (OXY IR/ROXICODONE) 5 MG immediate release tablet Take 1 tablet by mouth every 4 (four) hours as needed for moderate pain or severe pain.    potassium chloride (K-DUR,KLOR-CON) 10  MEQ tablet Take 2 tablets (20 mEq total) by mouth 2 (two) times daily.   potassium chloride (KLOR-CON) 10 MEQ tablet    PRESCRIPTION MEDICATION Take 1 tablet by mouth daily. Pink pill, oval in shape with an L and a 200   rosuvastatin (CRESTOR) 20 MG tablet Take 0.5 tablets (10 mg total) by mouth daily. (Patient taking differently: Take 20 mg by mouth daily. )   sulfamethoxazole-trimethoprim (BACTRIM DS) 800-160 MG tablet    telmisartan-hydrochlorothiazide (MICARDIS HCT) 80-12.5 MG tablet Take 1 tablet by mouth daily.   traMADol (ULTRAM) 50 MG tablet Take 1-2 tablets by  mouth every 6 (six) hours as needed for moderate pain.   zolpidem (AMBIEN) 10 MG tablet Take 5 mg by mouth at bedtime.    No current facility-administered medications for this visit. (Other)   REVIEW OF SYSTEMS:    ALLERGIES Allergies  Allergen Reactions   Atenolol Hydrochloride Cough   Vicodin [Hydrocodone-Acetaminophen]     Hyper    PAST MEDICAL HISTORY Past Medical History:  Diagnosis Date   Cataract    Mixed form OU   DDD (degenerative disc disease), lumbar    Glaucoma    Hyperlipemia    Hypertension    Hypertensive retinopathy    OU   Hypothyroidism    Macular degeneration    Non-exu OU   Myocardial infarction [facility](HCC)    ? Takotsubo   Ocular hypertension    Sciatica of right side without back pain 12/30/2012   TMJ (dislocation of temporomandibular joint)    Past Surgical History:  Procedure Laterality Date   ABDOMINAL HYSTERECTOMY     ABDOMINAL SACROCOLPOPEXY     APOGEE / PERIGEE REPAIR     BREAST BIOPSY     DILATION AND CURETTAGE OF UTERUS     HAND SURGERY Left 12/07/12   LEFT HEART CATHETERIZATION WITH CORONARY ANGIOGRAM N/A 01/18/2011   Procedure: LEFT HEART CATHETERIZATION WITH CORONARY ANGIOGRAM;  Surgeon: Laurey Moralealton S McLean, MD;  Location: [facility] C[facility] LAB;  Service: Cardiovascular;  Laterality: N/A;   TONSILLECTOMY     FAMILY HISTORY Family History  Problem Relation Age of Onset   Coronary artery disease Mother 2980   Hyperlipidemia Mother    Hypertension Mother    Prostate cancer Father    Hypertension Father    Hyperlipidemia Father    SOCIAL HISTORY Social History   Tobacco Use   Smoking status: Never   Smokeless tobacco: Never  Substance Use Topics   Alcohol use: Yes    Alcohol/week: 1.0 standard drink    Types: 1 Glasses of wine per week   Drug use: No         OPHTHALMIC EXAM:  Not recorded     IMAGING AND PROCEDURES  Imaging and Procedures for @TODAY @          ASSESSMENT/PLAN:  No diagnosis found.  1,2. Intermediate  stage age related macular degeneration, non-exudative, both eyes  - OCT OD : Stable PEDS ?isolated increase in focal PED temporal to fovea; OS: Peristent PEDs--no significant change from prior   - The incidence, anatomy, and pathology of dry AMD, risk of progression, and the AREDS and AREDS 2 study including smoking risks discussed with patient.   - Recommend amsler grid monitoring  - f/u 6-9 mos, sooner prn -- DFE/OCT  3,4. Hypertensive retinopathy OU  - discussed importance of tight BP control  - monitor   5. Ocular Hypertension OU  - IOP 10, 14 today  - takes Timolol QAM OU,  Travatan QHS OU, Trusopt BID OU  - under the management of Dr. Dione Booze  6. Pseudophakia OU  - s/p CE/IOL OU, Dr. Dione Booze  - IOLs in good position, doing well  - monitor   Ophthalmic Meds Ordered this visit:  No orders of the defined types were placed in this encounter.      No follow-ups on file.  There are no Patient Instructions on file for this visit.  This document serves as a record of services personally performed by Karie Chimera, MD, PhD. It was created on their behalf by De Blanch, an ophthalmic technician. The creation of this record is the provider's dictation and/or activities during the visit.    Electronically signed by: De Blanch, OA, 04/09/21  10:46 AM   Electronically signed by: Cristopher Estimable, COT 7.28.22 @ 10:45 AM   Karie Chimera, M.D., Ph.D. Diseases & Surgery of the Retina and Vitreous [facility] 10/05/2020  I have reviewed the above documentation for accuracy and completeness, and I agree with the above. Karie Chimera, M.D., Ph.D. 10/06/20 10:45 AM   Abbreviations: M myopia (nearsighted); A astigmatism; H hyperopia (farsighted); P presbyopia; Mrx spectacle prescription;  CTL contact lenses; OD right eye; OS left eye; OU both eyes  XT exotropia; ET esotropia; PEK punctate epithelial keratitis; PEE punctate epithelial erosions; DES dry  eye syndrome; MGD meibomian gland dysfunction; ATs artificial tears; PFAT's preservative free artificial tears; NSC nuclear sclerotic cataract; PSC posterior subcapsular cataract; ERM epi-retinal membrane; PVD posterior vitreous detachment; RD retinal detachment; DM diabetes mellitus; DR diabetic retinopathy; NPDR non-proliferative diabetic retinopathy; PDR proliferative diabetic retinopathy; CSME clinically significant macular edema; DME diabetic macular edema; dbh dot blot hemorrhages; CWS cotton wool spot; POAG primary open angle glaucoma; C/D cup-to-disc ratio; HVF humphrey visual field; GVF goldmann visual field; OCT optical coherence tomography; IOP intraocular pressure; BRVO Branch retinal vein occlusion; CRVO central retinal vein occlusion; CRAO central retinal artery occlusion; BRAO branch retinal artery occlusion; RT retinal tear; SB scleral buckle; PPV pars plana vitrectomy; VH Vitreous hemorrhage; PRP panretinal laser photocoagulation; IVK intravitreal kenalog; VMT vitreomacular traction; MH Macular hole;  NVD neovascularization of the disc; NVE neovascularization elsewhere; AREDS age related eye disease study; ARMD age related macular degeneration; POAG primary open angle glaucoma; EBMD epithelial/anterior basement membrane dystrophy; ACIOL anterior chamber intraocular lens; IOL intraocular lens; PCIOL posterior chamber intraocular lens; Phaco/IOL phacoemulsification with intraocular lens placement; PRK photorefractive keratectomy; LASIK laser assisted in situ keratomileusis; HTN hypertension; DM diabetes mellitus; COPD chronic obstructive pulmonary disease

## 2021-04-11 ENCOUNTER — Encounter (INDEPENDENT_AMBULATORY_CARE_PROVIDER_SITE_OTHER): Payer: Medicare PPO | Admitting: Ophthalmology

## 2021-04-11 DIAGNOSIS — Z961 Presence of intraocular lens: Secondary | ICD-10-CM

## 2021-04-11 DIAGNOSIS — H353132 Nonexudative age-related macular degeneration, bilateral, intermediate dry stage: Secondary | ICD-10-CM

## 2021-04-11 DIAGNOSIS — H35033 Hypertensive retinopathy, bilateral: Secondary | ICD-10-CM

## 2021-04-11 DIAGNOSIS — I10 Essential (primary) hypertension: Secondary | ICD-10-CM

## 2021-04-11 DIAGNOSIS — H40053 Ocular hypertension, bilateral: Secondary | ICD-10-CM

## 2021-05-10 ENCOUNTER — Other Ambulatory Visit: Payer: Self-pay

## 2021-05-10 ENCOUNTER — Encounter (INDEPENDENT_AMBULATORY_CARE_PROVIDER_SITE_OTHER): Payer: Self-pay | Admitting: Ophthalmology

## 2021-05-10 ENCOUNTER — Ambulatory Visit (INDEPENDENT_AMBULATORY_CARE_PROVIDER_SITE_OTHER): Payer: Medicare PPO | Admitting: Ophthalmology

## 2021-05-10 DIAGNOSIS — H40053 Ocular hypertension, bilateral: Secondary | ICD-10-CM | POA: Diagnosis not present

## 2021-05-10 DIAGNOSIS — H353132 Nonexudative age-related macular degeneration, bilateral, intermediate dry stage: Secondary | ICD-10-CM

## 2021-05-10 DIAGNOSIS — I10 Essential (primary) hypertension: Secondary | ICD-10-CM

## 2021-05-10 DIAGNOSIS — H35033 Hypertensive retinopathy, bilateral: Secondary | ICD-10-CM | POA: Diagnosis not present

## 2021-05-10 DIAGNOSIS — Z961 Presence of intraocular lens: Secondary | ICD-10-CM

## 2021-05-10 NOTE — Progress Notes (Signed)
[facility] - Clinic Note  05/10/2021     CHIEF COMPLAINT Patient presents for Retina Follow Up    HISTORY OF PRESENT ILLNESS: Michelle Bruce is a 76 y.o. female who presents to the clinic today for:   HPI     Retina Follow Up   Patient presents with  Dry AMD.  In both eyes.  Duration of 7 months.  Since onset it is stable.  I, the attending physician,  performed the HPI with the patient and updated documentation appropriately.        Comments   7 month follow up ARMD OU- Doing well, no new problems.       Last edited by [name], MD on 05/11/2021  9:18 AM.      Referring physician: Ernesto Rutherford, MD [address] [place],  [place] [address]  HISTORICAL INFORMATION:   Selected notes from the MEDICAL RECORD NUMBER Referred by Dr. Ernesto Rutherford for concern of non-exu ARMD OU   CURRENT MEDICATIONS: Current Outpatient Medications (Ophthalmic Drugs)  Medication Sig   timolol (TIMOPTIC) 0.5 % ophthalmic solution Place 1 drop into both eyes daily.    TRAVATAN Z 0.004 % SOLN ophthalmic solution Place 1 drop into both eyes at bedtime.    dorzolamide (TRUSOPT) 2 % ophthalmic solution Place 1 drop into both eyes 2 (two) times daily.   No current facility-administered medications for this visit. (Ophthalmic Drugs)   Current Outpatient Medications (Other)  Medication Sig   acetaminophen (TYLENOL) 500 MG tablet Take 500 mg by mouth every 6 (six) hours as needed for mild pain, moderate pain or fever.   Calcium Carb-Cholecalciferol (OYSTER SHELL CALCIUM) 500-400 MG-UNIT TABS Calcium 500 + D 500 mg (1,250 mg)-400 unit tablet   calcium citrate-vitamin D (CITRACAL+D) 315-200 MG-UNIT per tablet Take 1-2 tablets by mouth daily.    chlorhexidine (PERIDEX) 0.12 % solution ml    RINSE WITH ONE CAPFUL FOR 30 SECONDS AND SPIT USE TWICE DAILY BEGIN ONE WEEK PRIOR TO SURGERY   diclofenac Sodium (VOLTAREN) 1 % GEL diclofenac 1 % topical gel   dicyclomine (BENTYL) 10 MG  capsule    estradiol (ESTRACE) 0.1 MG/GM vaginal cream estradiol 0.01% (0.1 mg/gram) vaginal cream   1 applicatorful twice a week by vaginal route.   estradiol (VIVELLE-DOT) 0.1 MG/24HR Place 1 patch onto the skin 2 (two) times a week. Change on Wednesday and Saturday   famotidine (PEPCID) 40 MG tablet Bottle   fosfomycin (MONUROL) 3 g PACK Take 1 tablet every other day starting 04/29/19   gabapentin (NEURONTIN) 300 MG capsule Take 300 mg by mouth See admin instructions. 1 tablet by mouth 3 times daily for 2 weeks then 1 tablet by mouth twice daily for 2 weeks then 1 tablet by mouth once daily for 2 weeks.   ibuprofen (ADVIL) 200 MG tablet Take 200 mg by mouth every 6 (six) hours as needed for fever or moderate pain.   levothyroxine (SYNTHROID, LEVOTHROID) 100 MCG tablet Take 100 mcg by mouth daily before breakfast.   liothyronine (CYTOMEL) 5 MCG tablet Take 5 mcg by mouth daily.   meloxicam (MOBIC) 7.5 MG tablet meloxicam 7.5 mg tablet   1 tablet every day by oral route.   methocarbamol (ROBAXIN) 500 MG tablet Take 1 tablet by mouth 3 (three) times daily as needed for muscle spasms.   nitrofurantoin, macrocrystal-monohydrate, (MACROBID) 100 MG capsule    potassium chloride (K-DUR,KLOR-CON) 10 MEQ tablet Take 2 tablets (20  mEq total) by mouth 2 (two) times daily.   potassium chloride (KLOR-CON) 10 MEQ tablet    telmisartan-hydrochlorothiazide (MICARDIS HCT) 80-12.5 MG tablet Take 1 tablet by mouth daily.   zolpidem (AMBIEN) 10 MG tablet Take 5 mg by mouth at bedtime.    benzonatate (TESSALON) 200 MG capsule TAKE 1 CAPSULE BY MOUTH THREE TIMES DAILY AS NEEDED FOR 7 DAYS (Patient not taking: Reported on 05/10/2021)   levothyroxine (SYNTHROID, LEVOTHROID) 75 MCG tablet Take 1 tablet (75 mcg total) by mouth daily. (Patient not taking: Reported on 04/26/2019)   ondansetron (ZOFRAN) 4 MG tablet Take 1 tablet by mouth every 6 (six) hours as needed for nausea. (Patient not taking: Reported on 05/10/2021)    oxyCODONE (OXY IR/ROXICODONE) 5 MG immediate release tablet Take 1 tablet by mouth every 4 (four) hours as needed for moderate pain or severe pain.  (Patient not taking: Reported on 05/10/2021)   PRESCRIPTION MEDICATION Take 1 tablet by mouth daily. Pink pill, oval in shape with an L and a 200 (Patient not taking: Reported on 05/10/2021)   rosuvastatin (CRESTOR) 20 MG tablet Take 0.5 tablets (10 mg total) by mouth daily. (Patient taking differently: Take 20 mg by mouth daily. )   sulfamethoxazole-trimethoprim (BACTRIM DS) 800-160 MG tablet  (Patient not taking: Reported on 05/10/2021)   traMADol (ULTRAM) 50 MG tablet Take 1-2 tablets by mouth every 6 (six) hours as needed for moderate pain. (Patient not taking: Reported on 05/10/2021)   No current facility-administered medications for this visit. (Other)   REVIEW OF SYSTEMS: ROS   Positive for: Gastrointestinal, Musculoskeletal, Endocrine, Cardiovascular, Eyes Negative for: Constitutional, Neurological, Skin, Genitourinary, HENT, Respiratory, Psychiatric, Allergic/Imm, Heme/Lymph Last edited by Joni ReiningHodges, Robin, COA on 05/10/2021 10:04 AM.      ALLERGIES Allergies  Allergen Reactions   Atenolol Hydrochloride Cough   Vicodin [Hydrocodone-Acetaminophen]     Hyper    PAST MEDICAL HISTORY Past Medical History:  Diagnosis Date   Cataract    Mixed form OU   DDD (degenerative disc disease), lumbar    Glaucoma    Hyperlipemia    Hypertension    Hypertensive retinopathy    OU   Hypothyroidism    Macular degeneration    Non-exu OU   Myocardial infarction [facility](HCC)    ? Takotsubo   Ocular hypertension    Sciatica of right side without back pain 12/30/2012   TMJ (dislocation of temporomandibular joint)    Past Surgical History:  Procedure Laterality Date   ABDOMINAL HYSTERECTOMY     ABDOMINAL SACROCOLPOPEXY     APOGEE / PERIGEE REPAIR     BREAST BIOPSY     DILATION AND CURETTAGE OF UTERUS     HAND SURGERY Left 12/07/12   LEFT HEART  CATHETERIZATION WITH CORONARY ANGIOGRAM N/A 01/18/2011   Procedure: LEFT HEART CATHETERIZATION WITH CORONARY ANGIOGRAM;  Surgeon: Laurey Moralealton S McLean, MD;  [facility];  Service: Cardiovascular;  Laterality: N/A;   TONSILLECTOMY     FAMILY HISTORY Family History  Problem Relation Age of Onset   Coronary artery disease Mother 2880   Hyperlipidemia Mother    Hypertension Mother    Prostate cancer Father    Hypertension Father    Hyperlipidemia Father    SOCIAL HISTORY Social History   Tobacco Use   Smoking status: Never   Smokeless tobacco: Never  Substance Use Topics   Alcohol use: Yes    Alcohol/week: 1.0 standard drink    Types: 1 Glasses of wine per week  Drug use: No         OPHTHALMIC EXAM:  Base Eye Exam     Visual Acuity (Snellen - Linear)       Right Left   Dist cc 20/30 -2 20/30 -2   Dist ph cc NI NI    Correction: Glasses         Tonometry (Tonopen, 10:12 AM)       Right Left   Pressure 9 8         Pupils       Dark Light Shape React APD   Right 4 3 Round Brisk None   Left 4 3 Round Brisk None         Visual Fields (Counting fingers)       Left Right    Full Full         Extraocular Movement       Right Left    Full Full         Neuro/Psych     Oriented x3: Yes   Mood/Affect: Normal         Dilation     Both eyes: 1.0% Mydriacyl, 2.5% Phenylephrine @ 10:12 AM           Slit Lamp and Fundus Exam     Slit Lamp Exam       Right Left   Lids/Lashes Dermatochalasis - upper lid, mild Meibomian gland dysfunction Dermatochalasis - upper lid, Meibomian gland dysfunction   Conjunctiva/Sclera White and quiet White and quiet   Cornea Arcus, 1+ fine Punctate epithelial erosions, well healed temporal cataract wound Arcus, 2+ inferior Punctate epithelial erosions inferiorly, well healed temporal cataract wound   Anterior Chamber Deep and quiet Deep and quiet   Iris Round and moderately dilated Round and moderately  dilated   Lens PCIOL in excellent position; trace PCO PCIOL in excellent position   Anterior Vitreous Vitreous syneresis, Posterior vitreous detachment Vitreous syneresis, mild condensations         Fundus Exam       Right Left   Disc Sharp rim, inferior rim thinning Pink and Sharp, Compact   C/D Ratio 0.6 0.5   Macula Flat, blunted foveal reflex, scattered Drusen, PEDs, Retinal pigment epithelial mottling, No heme or edema Flat, blunted foveal reflex, Drusen, PEDs, Retinal pigment epithelial mottling, no heme or edema   Vessels attenuated, mild tortuosity attenuated, Tortuous   Periphery Attached, mild reticular degeneration, shallow IT Retinoschisis -- stable, no heme, no RT/RD, pigmented cystoid degeneration IT quad Attached, shallow IT retinoschisis, no heme, no RT/RD           Refraction     Wearing Rx       Sphere Cylinder Axis Add   Right +0.00 +0.75 004 +1.75   Left +0.00 +0.75 117 +1.75            IMAGING AND PROCEDURES  Imaging and Procedures for @TODAY @  OCT, Retina - OU - Both Eyes       Right Eye Quality was good. Central Foveal Thickness: 312. Progression has been stable. Findings include retinal drusen , no SRF, no IRF, outer retinal atrophy, pigment epithelial detachment, abnormal foveal contour (Stable PEDS ?isolated increase in focal PED temporal to fovea).   Left Eye Quality was borderline. Central Foveal Thickness: 300. Progression has been stable. Findings include normal foveal contour, no SRF, no IRF, retinal drusen , pigment epithelial detachment, outer retinal atrophy (Peristent PEDs--no significant change from prior).   Notes *Images  captured and stored on drive  Diagnosis / Impression:  Non-exu ARMD OU OD: Stable PEDS ?isolated increase in focal PED temporal to fovea OS: Peristent PEDs--no significant change from prior  Clinical management:  See below  Abbreviations: NFP - Normal foveal profile. CME - cystoid macular edema. PED -  pigment epithelial detachment. IRF - intraretinal fluid. SRF - subretinal fluid. EZ - ellipsoid zone. ERM - epiretinal membrane. ORA - outer retinal atrophy. ORT - outer retinal tubulation. SRHM - subretinal hyper-reflective material            ASSESSMENT/PLAN:    ICD-10-CM   1. Intermediate stage nonexudative age-related macular degeneration of both eyes  H35.3132 OCT, Retina - OU - Both Eyes    2. Essential hypertension  I10     3. Hypertensive retinopathy of both eyes  H35.033     4. Ocular hypertension, bilateral  H40.053     5. Pseudophakia of both eyes  Z96.1      1. Intermediate stage age related macular degeneration, non-exudative, both eyes  - OCT OD : Stable PEDS ?isolated increase in focal PED temporal to fovea; OS: Peristent PEDs--no significant change from prior   - The incidence, anatomy, and pathology of dry AMD, risk of progression, and the AREDS and AREDS 2 study including smoking risks discussed with patient.   - Recommend amsler grid monitoring  - f/u 6 months, sooner prn -- DFE/OCT  2,3. Hypertensive retinopathy OU  - discussed importance of tight BP control  - monitor    4. Ocular Hypertension OU  - IOP 09,08 today  - takes Timolol QAM OU, Travatan QHS OU, Trusopt BID OU  - under the management of Dr. Dione Booze  5. Pseudophakia OU  - s/p CE/IOL OU, Dr. Dione Booze  - IOLs in good position, doing well  - monitor    Ophthalmic Meds Ordered this visit:  No orders of the defined types were placed in this encounter.    Return in about 6 months (around 11/10/2021) for f/u non-exu ARMD OU, DFE, OCT.  There are no Patient Instructions on file for this visit.  This document serves as a record of services personally performed by Karie Chimera, MD, PhD. It was created on their behalf by Joni Reining, an ophthalmic technician. The creation of this record is the provider's dictation and/or activities during the visit.    Electronically signed by: Joni Reining COA,  05/11/21  9:19 AM  This document serves as a record of services personally performed by Karie Chimera, MD, PhD. It was created on their behalf by Glee Arvin. Manson Passey, OA an ophthalmic technician. The creation of this record is the provider's dictation and/or activities during the visit.    Electronically signed by: Glee Arvin. Manson Passey, [place] 03.02.2023 9:19 AM  Karie Chimera, M.D., Ph.D. Diseases & Surgery of the Retina and Vitreous [facility]  I have reviewed the above documentation for accuracy and completeness, and I agree with the above. Karie Chimera, M.D., Ph.D. 05/11/21 9:19 AM   Abbreviations: M myopia (nearsighted); A astigmatism; H hyperopia (farsighted); P presbyopia; Mrx spectacle prescription;  CTL contact lenses; OD right eye; OS left eye; OU both eyes  XT exotropia; ET esotropia; PEK punctate epithelial keratitis; PEE punctate epithelial erosions; DES dry eye syndrome; MGD meibomian gland dysfunction; ATs artificial tears; PFAT's preservative free artificial tears; NSC nuclear sclerotic cataract; PSC posterior subcapsular cataract; ERM epi-retinal membrane; PVD posterior vitreous detachment; RD retinal detachment; DM diabetes  mellitus; DR diabetic retinopathy; NPDR non-proliferative diabetic retinopathy; PDR proliferative diabetic retinopathy; CSME clinically significant macular edema; DME diabetic macular edema; dbh dot blot hemorrhages; CWS cotton wool spot; POAG primary open angle glaucoma; C/D cup-to-disc ratio; HVF humphrey visual field; GVF goldmann visual field; OCT optical coherence tomography; IOP intraocular pressure; BRVO Branch retinal vein occlusion; CRVO central retinal vein occlusion; CRAO central retinal artery occlusion; BRAO branch retinal artery occlusion; RT retinal tear; SB scleral buckle; PPV pars plana vitrectomy; VH Vitreous hemorrhage; PRP panretinal laser photocoagulation; IVK intravitreal kenalog; VMT vitreomacular traction; MH Macular hole;   NVD neovascularization of the disc; NVE neovascularization elsewhere; AREDS age related eye disease study; ARMD age related macular degeneration; POAG primary open angle glaucoma; EBMD epithelial/anterior basement membrane dystrophy; ACIOL anterior chamber intraocular lens; IOL intraocular lens; PCIOL posterior chamber intraocular lens; Phaco/IOL phacoemulsification with intraocular lens placement; PRK photorefractive keratectomy; LASIK laser assisted in situ keratomileusis; HTN hypertension; DM diabetes mellitus; COPD chronic obstructive pulmonary disease

## 2021-05-11 ENCOUNTER — Encounter (INDEPENDENT_AMBULATORY_CARE_PROVIDER_SITE_OTHER): Payer: Self-pay | Admitting: Ophthalmology

## 2022-01-15 NOTE — Progress Notes (Signed)
[facility] Note  01/16/2022     CHIEF COMPLAINT Patient presents for Retina Follow Up    HISTORY OF PRESENT ILLNESS: Michelle Bruce is a 76 y.o. female who presents to the clinic today for:   HPI     Retina Follow Up   Patient presents with  Dry AMD.  In both eyes.  This started years ago.  Duration of 8 months.  Since onset it is stable.  I, the attending physician,  performed the HPI with the patient and updated documentation appropriately.        Comments   Patient feels that the vision is the same since her last appointment. She is using Travaprost OU QHS, Dorzolamide OU BID, and Timolol OU QAM.       Last edited by Bernarda Caffey, MD on 01/17/2022 10:55 PM.    Pt states vision is stable, no new health concerns   Referring physician: Jonathon Resides, MD [address] [place],  [place] [address]  HISTORICAL INFORMATION:   Selected notes from the MEDICAL RECORD NUMBER Referred by Dr. Clent Jacks for concern of non-exu ARMD OU   CURRENT MEDICATIONS: Current Outpatient Medications (Ophthalmic Drugs)  Medication Sig   dorzolamide (TRUSOPT) 2 % ophthalmic solution Place 1 drop into both eyes 2 (two) times daily.   timolol (TIMOPTIC) 0.5 % ophthalmic solution Place 1 drop into both eyes daily.    TRAVATAN Z 0.004 % SOLN ophthalmic solution Place 1 drop into both eyes at bedtime.    No current facility-administered medications for this visit. (Ophthalmic Drugs)   Current Outpatient Medications (Other)  Medication Sig   acetaminophen (TYLENOL) 500 MG tablet Take 500 mg by mouth every 6 (six) hours as needed for mild pain, moderate pain or fever.   benzonatate (TESSALON) 200 MG capsule TAKE 1 CAPSULE BY MOUTH THREE TIMES DAILY AS NEEDED FOR 7 DAYS (Patient not taking: Reported on 05/10/2021)   Calcium Carb-Cholecalciferol (OYSTER SHELL CALCIUM) 500-400 MG-UNIT TABS Calcium 500 + D 500 mg (1,250 mg)-400 unit tablet   calcium citrate-vitamin D  (CITRACAL+D) 315-200 MG-UNIT per tablet Take 1-2 tablets by mouth daily.    chlorhexidine (PERIDEX) 0.12 % solution ml    RINSE WITH ONE CAPFUL FOR 30 SECONDS AND SPIT USE TWICE DAILY BEGIN ONE WEEK PRIOR TO SURGERY   diclofenac Sodium (VOLTAREN) 1 % GEL diclofenac 1 % topical gel   dicyclomine (BENTYL) 10 MG capsule    estradiol (ESTRACE) 0.1 MG/GM vaginal cream estradiol 0.01% (0.1 mg/gram) vaginal cream   1 applicatorful twice a week by vaginal route.   estradiol (VIVELLE-DOT) 0.1 MG/24HR Place 1 patch onto the skin 2 (two) times a week. Change on Wednesday and Saturday   famotidine (PEPCID) 40 MG tablet Bottle   fosfomycin (MONUROL) 3 g PACK Take 1 tablet every other day starting 04/29/19   gabapentin (NEURONTIN) 300 MG capsule Take 300 mg by mouth See admin instructions. 1 tablet by mouth 3 times daily for 2 weeks then 1 tablet by mouth twice daily for 2 weeks then 1 tablet by mouth once daily for 2 weeks.   ibuprofen (ADVIL) 200 MG tablet Take 200 mg by mouth every 6 (six) hours as needed for fever or moderate pain.   levothyroxine (SYNTHROID, LEVOTHROID) 100 MCG tablet Take 100 mcg by mouth daily before breakfast.   levothyroxine (SYNTHROID, LEVOTHROID) 75 MCG tablet Take 1 tablet (75 mcg total) by mouth daily. (Patient not taking: Reported on 04/26/2019)  liothyronine (CYTOMEL) 5 MCG tablet Take 5 mcg by mouth daily.   meloxicam (MOBIC) 7.5 MG tablet meloxicam 7.5 mg tablet   1 tablet every day by oral route.   methocarbamol (ROBAXIN) 500 MG tablet Take 1 tablet by mouth 3 (three) times daily as needed for muscle spasms.   nitrofurantoin, macrocrystal-monohydrate, (MACROBID) 100 MG capsule    ondansetron (ZOFRAN) 4 MG tablet Take 1 tablet by mouth every 6 (six) hours as needed for nausea. (Patient not taking: Reported on 05/10/2021)   oxyCODONE (OXY IR/ROXICODONE) 5 MG immediate release tablet Take 1 tablet by mouth every 4 (four) hours as needed for moderate pain or severe pain.  (Patient  not taking: Reported on 05/10/2021)   potassium chloride (K-DUR,KLOR-CON) 10 MEQ tablet Take 2 tablets (20 mEq total) by mouth 2 (two) times daily.   potassium chloride (KLOR-CON) 10 MEQ tablet    PRESCRIPTION MEDICATION Take 1 tablet by mouth daily. Pink pill, oval in shape with an L and a 200 (Patient not taking: Reported on 05/10/2021)   rosuvastatin (CRESTOR) 20 MG tablet Take 0.5 tablets (10 mg total) by mouth daily. (Patient taking differently: Take 20 mg by mouth daily. )   sulfamethoxazole-trimethoprim (BACTRIM DS) 800-160 MG tablet  (Patient not taking: Reported on 05/10/2021)   telmisartan-hydrochlorothiazide (MICARDIS HCT) 80-12.5 MG tablet Take 1 tablet by mouth daily.   traMADol (ULTRAM) 50 MG tablet Take 1-2 tablets by mouth every 6 (six) hours as needed for moderate pain. (Patient not taking: Reported on 05/10/2021)   zolpidem (AMBIEN) 10 MG tablet Take 5 mg by mouth at bedtime.    No current facility-administered medications for this visit. (Other)   REVIEW OF SYSTEMS: ROS   Positive for: Gastrointestinal, Musculoskeletal, Endocrine, Cardiovascular, Eyes Negative for: Constitutional, Neurological, Skin, Genitourinary, HENT, Respiratory, Psychiatric, Allergic/Imm, Heme/Lymph Last edited by Annie Paras, COT on 01/16/2022  1:08 PM.     ALLERGIES Allergies  Allergen Reactions   Atenolol Hydrochloride Cough   Vicodin [Hydrocodone-Acetaminophen]     Hyper    PAST MEDICAL HISTORY Past Medical History:  Diagnosis Date   Cataract    Mixed form OU   DDD (degenerative disc disease), lumbar    Glaucoma    Hyperlipemia    Hypertension    Hypertensive retinopathy    OU   Hypothyroidism    Macular degeneration    Non-exu OU   Myocardial infarction [facility])    ? [place]otsub[place]   Ocular hypertension    Sciatica of right side without back pain 12/30/2012   TMJ (dislocation of temporomandibular joint)    Past Surgical History:  Procedure Laterality Date   ABDOMINAL HYSTERECTOMY      ABDOMINAL SACROCOLPOPEXY     APOGEE / PERIGEE REPAIR     BREAST BIOPSY     DILATION AND CURETTAGE OF UTERUS     HAND SURGERY Left 12/07/12   LEFT HEART CATHETERIZATION WITH CORONARY ANGIOGRAM N/A 01/18/2011   Procedure: LEFT HEART CATHETERIZATION WITH CORONARY ANGIOGRAM;  Surgeon: Larey Dresser, MD;  Location: [facility] CATH LAB;  Service: Cardiovascular;  Laterality: N/A;   TONSILLECTOMY     FAMILY HISTORY Family History  Problem Relation Age of Onset   Coronary artery disease Mother 36   Hyperlipidemia Mother    Hypertension Mother    Prostate cancer Father    Hypertension Father    Hyperlipidemia Father    SOCIAL HISTORY Social History   Tobacco Use   Smoking status: Never   Smokeless tobacco: Never  Substance  Use Topics   Alcohol use: Yes    Alcohol/week: 1.0 standard drink of alcohol    Types: 1 Glasses of wine per week   Drug use: No       OPHTHALMIC EXAM:  Base Eye Exam     Visual Acuity (Snellen - Linear)       Right Left   Dist cc 20/25 +2 20/25    Correction: Glasses         Tonometry (Tonopen, 1:11 PM)       Right Left   Pressure 17 16         Pupils       Dark Light Shape React APD   Right 4 3 Round Brisk None   Left 4 3 Round Brisk None         Visual Fields       Left Right    Full Full         Extraocular Movement       Right Left    Full, Ortho Full, Ortho         Neuro/Psych     Oriented x3: Yes   Mood/Affect: Normal         Dilation     Right eye: 1.0% Mydriacyl, 2.5% Phenylephrine @ 1:09 PM           Slit Lamp and Fundus Exam     Slit Lamp Exam       Right Left   Lids/Lashes Dermatochalasis - upper lid, mild MGD Dermatochalasis - upper lid, Meibomian gland dysfunction   Conjunctiva/Sclera White and quiet White and quiet   Cornea Arcus, well healed temporal cataract wound, trace PEE Arcus, well healed temporal cataract wound, 2+ fine Punctate epithelial erosions   Anterior Chamber deep and clear  deep and clear   Iris Round and moderately dilated Round and moderately dilated   Lens PCIOL in excellent position; trace PCO PCIOL in excellent position   Anterior Vitreous Vitreous syneresis, Posterior vitreous detachment Vitreous syneresis, mild condensations         Fundus Exam       Right Left   Disc Sharp rim, inferior rim thinning Pink and Sharp, Compact   C/D Ratio 0.6 0.5   Macula Flat, blunted foveal reflex, scattered Drusen, PEDs, Retinal pigment epithelial mottling, No heme or edema Flat, blunted foveal reflex, Drusen, PEDs, Retinal pigment epithelial mottling, no heme or edema   Vessels attenuated, mild tortuosity attenuated, Tortuous   Periphery Attached, mild reticular degeneration, shallow IT Retinoschisis -- stable, no heme, no RT/RD, pigmented cystoid degeneration IT quad Attached, shallow schisis IT periphery, no heme, no RT/RD           Refraction     Wearing Rx       Sphere Cylinder Axis Add   Right +0.00 +0.75 004 +1.75   Left +0.00 +0.75 117 +1.75            IMAGING AND PROCEDURES  Imaging and Procedures for @TODAY @  OCT, Retina - OU - Both Eyes       Right Eye Quality was good. Central Foveal Thickness: 294. Progression has been stable. Findings include normal foveal contour, no IRF, no SRF, retinal drusen , pigment epithelial detachment, outer retinal atrophy (Interval improvement in prominent PEDs and drusen).   Left Eye Quality was good. Central Foveal Thickness: 302. Progression has been stable. Findings include normal foveal contour, no IRF, no SRF, retinal drusen , pigment epithelial detachment, outer retinal atrophy (  Peristent PEDs--no significant change from prior).   Notes *Images captured and stored on drive  Diagnosis / Impression:  Non-exu ARMD OU OD: Interval improvement in prominent PEDs and drusen OS: Peristent PEDs--no significant change from prior  Clinical management:  See below  Abbreviations: NFP - Normal foveal  profile. CME - cystoid macular edema. PED - pigment epithelial detachment. IRF - intraretinal fluid. SRF - subretinal fluid. EZ - ellipsoid zone. ERM - epiretinal membrane. ORA - outer retinal atrophy. ORT - outer retinal tubulation. SRHM - subretinal hyper-reflective material            ASSESSMENT/PLAN:    ICD-10-CM   1. Intermediate stage nonexudative age-related macular degeneration of both eyes  H35.3132 OCT, Retina - OU - Both Eyes    2. Essential hypertension  I10     3. Hypertensive retinopathy of both eyes  H35.033     4. Ocular hypertension, bilateral  H40.053     5. Pseudophakia of both eyes  Z96.1     6. Bilateral retinoschisis  H33.103      1. Intermediate stage age related macular degeneration, non-exudative, both eyes  - OCT shows OD: Interval improvement in prominent PEDs and drusen; OS: Peristent PEDs  --  no significant change from prior   - The incidence, anatomy, and pathology of dry AMD, risk of progression, and the AREDS and AREDS 2 study including smoking risks discussed with patient.   - Cont amsler grid monitoring   - f/u 9 months, sooner prn -- DFE/OCT  2,3. Hypertensive retinopathy OU  - discussed importance of tight BP control  - monitor    4. Ocular Hypertension OU  - IOP 17,16 today  - takes Timolol QAM OU, Travatan QHS OU, Trusopt BID OU  - under the management of Dr. Katy Fitch  5. Pseudophakia OU  - s/p CE/IOL OU, Dr. Katy Fitch  - IOLs in good position, doing well  - monitor   6. Retinoschisis OU  - shallow schisis IT quad OU   Ophthalmic Meds Ordered this visit:  No orders of the defined types were placed in this encounter.    Return in about 9 months (around 10/17/2022) for f/u non-exu ARMD OU, DFE, OCT.  There are no Patient Instructions on file for this visit.  This document serves as a record of services personally performed by Gardiner Sleeper, MD, PhD. It was created on their behalf by Orvan Falconer, an ophthalmic technician. The  creation of this record is the provider's dictation and/or activities during the visit.    Electronically signed by: Orvan Falconer, OA, 01/17/22  10:56 PM  This document serves as a record of services personally performed by Gardiner Sleeper, MD, PhD. It was created on their behalf by San Jetty. Owens Shark, OA an ophthalmic technician. The creation of this record is the provider's dictation and/or activities during the visit.    Electronically signed by: San Jetty. Marguerita Merles 11.08.2023 10:56 PM  Gardiner Sleeper, M.D., Ph.D. Diseases & Surgery of the Retina and Vitreous [facility]  I have reviewed the above documentation for accuracy and completeness, and I agree with the above. Gardiner Sleeper, M.D., Ph.D. 01/17/22 10:58 PM   Abbreviations: M myopia (nearsighted); A astigmatism; H hyperopia (farsighted); P presbyopia; Mrx spectacle prescription;  CTL contact lenses; OD right eye; OS left eye; OU both eyes  XT exotropia; ET esotropia; PEK punctate epithelial keratitis; PEE punctate epithelial erosions; DES dry eye syndrome; MGD meibomian gland  dysfunction; ATs artificial tears; PFAT's preservative free artificial tears; [place] nuclear sclerotic cataract; PSC posterior subcapsular cataract; ERM epi-retinal membrane; PVD posterior vitreous detachment; RD retinal detachment; DM diabetes mellitus; DR diabetic retinopathy; NPDR non-proliferative diabetic retinopathy; PDR proliferative diabetic retinopathy; CSME clinically significant macular edema; DME diabetic macular edema; dbh dot blot hemorrhages; CWS cotton wool spot; POAG primary open angle glaucoma; C/D cup-to-disc ratio; HVF humphrey visual field; GVF goldmann visual field; OCT optical coherence tomography; IOP intraocular pressure; BRVO Branch retinal vein occlusion; CRVO central retinal vein occlusion; CRAO central retinal artery occlusion; BRAO branch retinal artery occlusion; RT retinal tear; SB scleral buckle; PPV pars plana  vitrectomy; VH Vitreous hemorrhage; PRP panretinal laser photocoagulation; IVK intravitreal kenalog; VMT vitreomacular traction; MH Macular hole;  NVD neovascularization of the disc; NVE neovascularization elsewhere; AREDS age related eye disease study; ARMD age related macular degeneration; POAG primary open angle glaucoma; EBMD epithelial/anterior basement membrane dystrophy; ACIOL anterior chamber intraocular lens; IOL intraocular lens; PCIOL posterior chamber intraocular lens; Phaco/IOL phacoemulsification with intraocular lens placement; [place] photorefractive keratectomy; LASIK laser assisted in situ keratomileusis; HTN hypertension; DM diabetes mellitus; COPD chronic obstructive pulmonary disease

## 2022-01-16 ENCOUNTER — Ambulatory Visit (INDEPENDENT_AMBULATORY_CARE_PROVIDER_SITE_OTHER): Payer: Medicare PPO | Admitting: Ophthalmology

## 2022-01-16 DIAGNOSIS — H353132 Nonexudative age-related macular degeneration, bilateral, intermediate dry stage: Secondary | ICD-10-CM | POA: Diagnosis not present

## 2022-01-16 DIAGNOSIS — I10 Essential (primary) hypertension: Secondary | ICD-10-CM

## 2022-01-16 DIAGNOSIS — H35033 Hypertensive retinopathy, bilateral: Secondary | ICD-10-CM | POA: Diagnosis not present

## 2022-01-16 DIAGNOSIS — Z961 Presence of intraocular lens: Secondary | ICD-10-CM

## 2022-01-16 DIAGNOSIS — H40053 Ocular hypertension, bilateral: Secondary | ICD-10-CM

## 2022-01-16 DIAGNOSIS — H33103 Unspecified retinoschisis, bilateral: Secondary | ICD-10-CM

## 2022-01-17 ENCOUNTER — Encounter (INDEPENDENT_AMBULATORY_CARE_PROVIDER_SITE_OTHER): Payer: Self-pay | Admitting: Ophthalmology

## 2022-10-18 ENCOUNTER — Encounter (INDEPENDENT_AMBULATORY_CARE_PROVIDER_SITE_OTHER): Payer: Medicare PPO | Admitting: Ophthalmology

## 2022-11-15 ENCOUNTER — Encounter (INDEPENDENT_AMBULATORY_CARE_PROVIDER_SITE_OTHER): Payer: Medicare PPO | Admitting: Ophthalmology

## 2022-11-18 NOTE — Progress Notes (Signed)
[facility] - Clinic Note  11/26/2022     CHIEF COMPLAINT Patient presents for Retina Follow Up    HISTORY OF PRESENT ILLNESS: Michelle Bruce is a 77 y.o. female who presents to the clinic today for:   HPI     Retina Follow Up   Patient presents with  Dry AMD.  In both eyes.  This started 9 months ago.  I, the attending physician,  performed the HPI with the patient and updated documentation appropriately.        Comments   Patient here for 9 months retina follow up for non exu ARMD OU. Patient states vision doing pretty good. No eye pain. Using drops QID OU.      Last edited by [name], MD on 11/28/2022  5:05 PM.    Pt states vision is the same, she is still checking her vision on an amsler grid and taking AREDS 2   Referring physician: [name], MD [address] [place],  [place] [address]  HISTORICAL INFORMATION:   Selected notes from the MEDICAL RECORD NUMBER Referred by Dr. Ernesto Rutherford for concern of non-exu ARMD OU   CURRENT MEDICATIONS: Current Outpatient Medications (Ophthalmic Drugs)  Medication Sig   dorzolamide (TRUSOPT) 2 % ophthalmic solution Place 1 drop into both eyes 2 (two) times daily.   timolol (TIMOPTIC) 0.5 % ophthalmic solution Place 1 drop into both eyes daily.    TRAVATAN Z 0.004 % SOLN ophthalmic solution Place 1 drop into both eyes at bedtime.    No current facility-administered medications for this visit. (Ophthalmic Drugs)   Current Outpatient Medications (Other)  Medication Sig   acetaminophen (TYLENOL) 500 MG tablet Take 500 mg by mouth every 6 (six) hours as needed for mild pain, moderate pain or fever.   Calcium Carb-Cholecalciferol (OYSTER SHELL CALCIUM) 500-400 MG-UNIT TABS Calcium 500 + D 500 mg (1,250 mg)-400 unit tablet   calcium citrate-vitamin D (CITRACAL+D) 315-200 MG-UNIT per tablet Take 1-2 tablets by mouth daily.    diclofenac Sodium (VOLTAREN) 1 % GEL diclofenac 1 % topical gel    dicyclomine (BENTYL) 10 MG capsule    estradiol (ESTRACE) 0.1 MG/GM vaginal cream estradiol 0.01% (0.1 mg/gram) vaginal cream   1 applicatorful twice a week by vaginal route.   estradiol (VIVELLE-DOT) 0.1 MG/24HR Place 1 patch onto the skin 2 (two) times a week. Change on Wednesday and Saturday   famotidine (PEPCID) 40 MG tablet Bottle   ibuprofen (ADVIL) 200 MG tablet Take 200 mg by mouth every 6 (six) hours as needed for fever or moderate pain.   nitrofurantoin, macrocrystal-monohydrate, (MACROBID) 100 MG capsule    potassium chloride (K-DUR,KLOR-CON) 10 MEQ tablet Take 2 tablets (20 mEq total) by mouth 2 (two) times daily.   potassium chloride (KLOR-CON) 10 MEQ tablet    telmisartan-hydrochlorothiazide (MICARDIS HCT) 80-12.5 MG tablet Take 1 tablet by mouth daily.   zolpidem (AMBIEN) 10 MG tablet Take 5 mg by mouth at bedtime.    benzonatate (TESSALON) 200 MG capsule TAKE 1 CAPSULE BY MOUTH THREE TIMES DAILY AS NEEDED FOR 7 DAYS (Patient not taking: Reported on 05/10/2021)   chlorhexidine (PERIDEX) 0.12 % solution ml    RINSE WITH ONE CAPFUL FOR 30 SECONDS AND SPIT USE TWICE DAILY BEGIN ONE WEEK PRIOR TO SURGERY   fosfomycin (MONUROL) 3 g PACK Take 1 tablet every other day starting 04/29/19   gabapentin (NEURONTIN) 300 MG capsule Take 300 mg by mouth See admin instructions. 1  tablet by mouth 3 times daily for 2 weeks then 1 tablet by mouth twice daily for 2 weeks then 1 tablet by mouth once daily for 2 weeks.   levothyroxine (SYNTHROID, LEVOTHROID) 100 MCG tablet Take 100 mcg by mouth daily before breakfast.   levothyroxine (SYNTHROID, LEVOTHROID) 75 MCG tablet Take 1 tablet (75 mcg total) by mouth daily. (Patient not taking: Reported on 04/26/2019)   liothyronine (CYTOMEL) 5 MCG tablet Take 5 mcg by mouth daily.   meloxicam (MOBIC) 7.5 MG tablet meloxicam 7.5 mg tablet   1 tablet every day by oral route.   methocarbamol (ROBAXIN) 500 MG tablet Take 1 tablet by mouth 3 (three) times daily as  needed for muscle spasms.   ondansetron (ZOFRAN) 4 MG tablet Take 1 tablet by mouth every 6 (six) hours as needed for nausea. (Patient not taking: Reported on 05/10/2021)   oxyCODONE (OXY IR/ROXICODONE) 5 MG immediate release tablet Take 1 tablet by mouth every 4 (four) hours as needed for moderate pain or severe pain.  (Patient not taking: Reported on 05/10/2021)   PRESCRIPTION MEDICATION Take 1 tablet by mouth daily. Pink pill, oval in shape with an L and a 200 (Patient not taking: Reported on 05/10/2021)   rosuvastatin (CRESTOR) 20 MG tablet Take 0.5 tablets (10 mg total) by mouth daily. (Patient taking differently: Take 20 mg by mouth daily. )   sulfamethoxazole-trimethoprim (BACTRIM DS) 800-160 MG tablet  (Patient not taking: Reported on 05/10/2021)   traMADol (ULTRAM) 50 MG tablet Take 1-2 tablets by mouth every 6 (six) hours as needed for moderate pain. (Patient not taking: Reported on 05/10/2021)   No current facility-administered medications for this visit. (Other)   REVIEW OF SYSTEMS: ROS   Positive for: Gastrointestinal, Musculoskeletal, Endocrine, Cardiovascular, Eyes Negative for: Constitutional, Neurological, Skin, Genitourinary, HENT, Respiratory, Psychiatric, Allergic/Imm, Heme/Lymph Last edited by Laddie Aquas, COA on 11/26/2022 12:57 PM.      ALLERGIES Allergies  Allergen Reactions   Atenolol Hydrochloride Cough   Vicodin [Hydrocodone-Acetaminophen]     Hyper    PAST MEDICAL HISTORY Past Medical History:  Diagnosis Date   Cataract    Mixed form OU   DDD (degenerative disc disease), lumbar    Glaucoma    Hyperlipemia    Hypertension    Hypertensive retinopathy    OU   Hypothyroidism    Macular degeneration    Non-exu OU   Myocardial infarction [facility])    ? Takotsubo   Ocular hypertension    Sciatica of right side without back pain 12/30/2012   TMJ (dislocation of temporomandibular joint)    Past Surgical History:  Procedure Laterality Date   ABDOMINAL  HYSTERECTOMY     ABDOMINAL SACROCOLPOPEXY     APOGEE / PERIGEE REPAIR     BREAST BIOPSY     DILATION AND CURETTAGE OF UTERUS     HAND SURGERY Left 12/07/12   LEFT HEART CATHETERIZATION WITH CORONARY ANGIOGRAM N/A 01/18/2011   Procedure: LEFT HEART CATHETERIZATION WITH CORONARY ANGIOGRAM;  Surgeon: Laurey Morale, MD;  Location: [facility] CATH LAB;  Service: Cardiovascular;  Laterality: N/A;   TONSILLECTOMY     FAMILY HISTORY Family History  Problem Relation Age of Onset   Coronary artery disease Mother 16   Hyperlipidemia Mother    Hypertension Mother    Prostate cancer Father    Hypertension Father    Hyperlipidemia Father    SOCIAL HISTORY Social History   Tobacco Use   Smoking status: Never   Smokeless tobacco:  Never  Vaping Use   Vaping status: Never Used  Substance Use Topics   Alcohol use: Yes    Alcohol/week: 1.0 standard drink of alcohol    Types: 1 Glasses of wine per week   Drug use: No       OPHTHALMIC EXAM:  Base Eye Exam     Visual Acuity (Snellen - Linear)       Right Left   Dist cc 20/25 20/25   Dist ph cc NI NI    Correction: Glasses         Tonometry (Tonopen, 12:54 PM)       Right Left   Pressure 10 10         Pupils       Dark Light Shape React APD   Right 4 3 Round Brisk None   Left 4 3 Round Brisk None         Visual Fields (Counting fingers)       Left Right    Full Full         Extraocular Movement       Right Left    Full, Ortho Full, Ortho         Neuro/Psych     Oriented x3: Yes   Mood/Affect: Normal         Dilation     Both eyes: 1.0% Mydriacyl, 2.5% Phenylephrine @ 12:54 PM           Slit Lamp and Fundus Exam     Slit Lamp Exam       Right Left   Lids/Lashes Dermatochalasis - upper lid, mild MGD Dermatochalasis - upper lid   Conjunctiva/Sclera White and quiet White and quiet   Cornea Arcus, well healed temporal cataract wound, 1-2+ Punctate epithelial erosions centrally Arcus, well healed  temporal cataract wound, 2+ fine Punctate epithelial erosions   Anterior Chamber deep and clear deep and clear   Iris Round and moderately dilated Round and moderately dilated   Lens PCIOL in excellent position; 1+PCO PCIOL in excellent position   Anterior Vitreous mild syneresis, Posterior vitreous detachment, trace pigment Vitreous syneresis, mild condensations         Fundus Exam       Right Left   Disc Pink and Sharp, inferior rim thiniing Pink and Sharp, Compact   C/D Ratio 0.6 0.5   Macula Flat, blunted foveal reflex, scattered Drusen, PEDs, Retinal pigment epithelial mottling and early atrophy, No heme or edema Flat, blunted foveal reflex, Drusen, PEDs, Retinal pigment epithelial mottling, no heme or edema   Vessels attenuated, mild tortuosity attenuated, mild tortuosity   Periphery Attached, mild reticular degeneration, shallow IT Retinoschisis -- stable, no heme, no RT/RD, pigmented cystoid degeneration IT quad Attached, shallow schisis IT periphery, no heme, no RT/RD           Refraction     Wearing Rx       Sphere Cylinder Axis Add   Right +0.00 +0.75 004 +1.75   Left +0.00 +0.75 117 +1.75            IMAGING AND PROCEDURES  Imaging and Procedures for @TODAY @  OCT, Retina - OU - Both Eyes       Right Eye Quality was good. Central Foveal Thickness: 299. Progression has been stable. Findings include normal foveal contour, no IRF, no SRF, retinal drusen , pigment epithelial detachment, outer retinal atrophy (Persistent PEDs and drusen, no fluid).   Left Eye Quality was good. Central  Foveal Thickness: 303. Progression has been stable. Findings include normal foveal contour, no IRF, no SRF, retinal drusen , pigment epithelial detachment, outer retinal atrophy (Peristent PEDs -- no significant change from prior, patchy ORA).   Notes *Images captured and stored on drive  Diagnosis / Impression:  Non-exu ARMD OU OD: Persistent PEDs and drusen, no fluid OS:  Peristent PEDs--no significant change from prior, patchy ORA  Clinical management:  See below  Abbreviations: NFP - Normal foveal profile. CME - cystoid macular edema. PED - pigment epithelial detachment. IRF - intraretinal fluid. SRF - subretinal fluid. EZ - ellipsoid zone. ERM - epiretinal membrane. ORA - outer retinal atrophy. ORT - outer retinal tubulation. SRHM - subretinal hyper-reflective material            ASSESSMENT/PLAN:    ICD-10-CM   1. Intermediate stage nonexudative age-related macular degeneration of both eyes  H35.3132 OCT, Retina - OU - Both Eyes    2. Essential hypertension  I10     3. Hypertensive retinopathy of both eyes  H35.033     4. Ocular hypertension, bilateral  H40.053     5. Pseudophakia of both eyes  Z96.1     6. Bilateral retinoschisis  H33.103      1. Intermediate stage age related macular degeneration, non-exudative, both eyes  - BCVA 20/25 OU -- stable  - OCT shows OD: Interval improvement in prominent PEDs and drusen; OS: Peristent PEDs  --  no significant change from prior   - The incidence, anatomy, and pathology of dry AMD, risk of progression, and the AREDS and AREDS 2 study including smoking risks discussed with patient.   - Cont amsler grid monitoring   - f/u 9 months, sooner prn -- DFE/OCT  2,3. Hypertensive retinopathy OU  - discussed importance of tight BP control  - monitor    4. Ocular Hypertension OU  - IOP 10 OU today  - takes Timolol QAM OU, Travatan QHS OU, Trusopt BID OU  - under the management of Dr. Dione Booze  5. Pseudophakia OU  - s/p CE/IOL OU, Dr. Dione Booze  - IOLs in good position, doing well  - monitor   6. Retinoschisis OU  - shallow schisis IT quad OU   Ophthalmic Meds Ordered this visit:  No orders of the defined types were placed in this encounter.    Return in about 9 months (around 08/26/2023) for f/u non-exu ARMD OU.  There are no Patient Instructions on file for this visit.  This document serves as  a record of services personally performed by Karie Chimera, MD, PhD. It was created on their behalf by Charlette Caffey, COT an ophthalmic technician. The creation of this record is the provider's dictation and/or activities during the visit.    Electronically signed by:  Charlette Caffey, COT  11/28/22 5:05 PM  This document serves as a record of services personally performed by Karie Chimera, MD, PhD. It was created on their behalf by Glee Arvin. Manson Passey, OA an ophthalmic technician. The creation of this record is the provider's dictation and/or activities during the visit.    Electronically signed by: Glee Arvin. Manson Passey, OA 11/28/22 5:05 PM  Karie Chimera, M.D., Ph.D. Diseases & Surgery of the Retina and Vitreous [facility]  I have reviewed the above documentation for accuracy and completeness, and I agree with the above. Karie Chimera, M.D., Ph.D. 11/28/22 5:10 PM  Abbreviations: M myopia (nearsighted); A astigmatism; H hyperopia (  farsighted); P presbyopia; Mrx spectacle prescription;  CTL contact lenses; OD right eye; OS left eye; OU both eyes  XT exotropia; ET esotropia; PEK punctate epithelial keratitis; PEE punctate epithelial erosions; DES dry eye syndrome; MGD meibomian gland dysfunction; ATs artificial tears; PFAT's preservative free artificial tears; NSC nuclear sclerotic cataract; PSC posterior subcapsular cataract; ERM epi-retinal membrane; PVD posterior vitreous detachment; RD retinal detachment; DM diabetes mellitus; DR diabetic retinopathy; NPDR non-proliferative diabetic retinopathy; PDR proliferative diabetic retinopathy; CSME clinically significant macular edema; DME diabetic macular edema; dbh dot blot hemorrhages; CWS cotton wool spot; POAG primary open angle glaucoma; C/D cup-to-disc ratio; HVF humphrey visual field; GVF goldmann visual field; OCT optical coherence tomography; IOP intraocular pressure; BRVO Branch retinal vein occlusion; CRVO  central retinal vein occlusion; CRAO central retinal artery occlusion; BRAO branch retinal artery occlusion; RT retinal tear; SB scleral buckle; PPV pars plana vitrectomy; VH Vitreous hemorrhage; PRP panretinal laser photocoagulation; IVK intravitreal kenalog; VMT vitreomacular traction; MH Macular hole;  NVD neovascularization of the disc; NVE neovascularization elsewhere; AREDS age related eye disease study; ARMD age related macular degeneration; POAG primary open angle glaucoma; EBMD epithelial/anterior basement membrane dystrophy; ACIOL anterior chamber intraocular lens; IOL intraocular lens; PCIOL posterior chamber intraocular lens; Phaco/IOL phacoemulsification with intraocular lens placement; PRK photorefractive keratectomy; LASIK laser assisted in situ keratomileusis; HTN hypertension; DM diabetes mellitus; COPD chronic obstructive pulmonary disease

## 2022-11-26 ENCOUNTER — Encounter (INDEPENDENT_AMBULATORY_CARE_PROVIDER_SITE_OTHER): Payer: Self-pay | Admitting: Ophthalmology

## 2022-11-26 ENCOUNTER — Ambulatory Visit (INDEPENDENT_AMBULATORY_CARE_PROVIDER_SITE_OTHER): Payer: Medicare PPO | Admitting: Ophthalmology

## 2022-11-26 DIAGNOSIS — H33103 Unspecified retinoschisis, bilateral: Secondary | ICD-10-CM | POA: Diagnosis not present

## 2022-11-26 DIAGNOSIS — H40053 Ocular hypertension, bilateral: Secondary | ICD-10-CM

## 2022-11-26 DIAGNOSIS — I10 Essential (primary) hypertension: Secondary | ICD-10-CM | POA: Diagnosis not present

## 2022-11-26 DIAGNOSIS — H35033 Hypertensive retinopathy, bilateral: Secondary | ICD-10-CM

## 2022-11-26 DIAGNOSIS — Z961 Presence of intraocular lens: Secondary | ICD-10-CM

## 2022-11-26 DIAGNOSIS — H353132 Nonexudative age-related macular degeneration, bilateral, intermediate dry stage: Secondary | ICD-10-CM | POA: Diagnosis not present

## 2022-11-28 ENCOUNTER — Encounter (INDEPENDENT_AMBULATORY_CARE_PROVIDER_SITE_OTHER): Payer: Self-pay | Admitting: Ophthalmology

## 2023-08-26 ENCOUNTER — Encounter (INDEPENDENT_AMBULATORY_CARE_PROVIDER_SITE_OTHER): Payer: Medicare PPO | Admitting: Ophthalmology

## 2023-08-27 NOTE — Progress Notes (Signed)
 [facility] - Clinic Note  09/03/2023     CHIEF COMPLAINT Patient presents for Retina Follow Up    HISTORY OF PRESENT ILLNESS: Michelle Bruce is a 78 y.o. female who presents to the clinic today for:   HPI     Retina Follow Up   Patient presents with  Wet AMD.  In both eyes.  Severity is moderate.  Duration of 9 months.  Since onset it is stable.  I, the attending physician,  performed the HPI with the patient and updated documentation appropriately.        Comments   Pt here for 9 mo ret f/u non exu ARMD OU. Pt states VA is the same, no changes she's noticed. Pt using dorzolamide  BID OU, timolol  QAM OU, Travatan at bedtime OU.       Last edited by Valdemar Rogue, MD on 09/07/2023 12:12 PM.     Pt states vision is stable, she is taking AREDS 2, pt switched from Dr. Octavia to Dr. Marcey bc Dr. Marcey is closer to home  Referring physician: Marcey Elspeth PARAS, MD [address] [facility] [place],  [place] [address]  HISTORICAL INFORMATION:   Selected notes from the MEDICAL RECORD NUMBER Referred by Dr. Lamar Octavia for concern of non-exu ARMD OU   CURRENT MEDICATIONS: Current Outpatient Medications (Ophthalmic Drugs)  Medication Sig   dorzolamide  (TRUSOPT ) 2 % ophthalmic solution Place 1 drop into both eyes 2 (two) times daily.   timolol  (TIMOPTIC ) 0.5 % ophthalmic solution Place 1 drop into both eyes daily.    TRAVATAN Z 0.004 % SOLN ophthalmic solution Place 1 drop into both eyes at bedtime.    No current facility-administered medications for this visit. (Ophthalmic Drugs)   Current Outpatient Medications (Other)  Medication Sig   acetaminophen  (TYLENOL ) 500 MG tablet Take 500 mg by mouth every 6 (six) hours as needed for mild pain, moderate pain or fever.   Calcium  Carb-Cholecalciferol  (OYSTER SHELL CALCIUM ) 500-400 MG-UNIT TABS Calcium  500 + D 500 mg (1,250 mg)-400 unit tablet   calcium  citrate-vitamin D  (CITRACAL+D) 315-200 MG-UNIT per tablet Take 1-2 tablets  by mouth daily.    diclofenac Sodium (VOLTAREN) 1 % GEL diclofenac 1 % topical gel   dicyclomine (BENTYL) 10 MG capsule    estradiol (ESTRACE) 0.1 MG/GM vaginal cream estradiol 0.01% (0.1 mg/gram) vaginal cream   1 applicatorful twice a week by vaginal route.   estradiol (VIVELLE-DOT) 0.1 MG/24HR Place 1 patch onto the skin 2 (two) times a week. Change on Wednesday and Saturday   famotidine (PEPCID) 40 MG tablet Bottle   ibuprofen (ADVIL) 200 MG tablet Take 200 mg by mouth every 6 (six) hours as needed for fever or moderate pain.   levothyroxine  (SYNTHROID , LEVOTHROID) 75 MCG tablet Take 1 tablet (75 mcg total) by mouth daily.   potassium chloride  (K-DUR,KLOR-CON ) 10 MEQ tablet Take 2 tablets (20 mEq total) by mouth 2 (two) times daily.   potassium chloride  (KLOR-CON ) 10 MEQ tablet    rosuvastatin  (CRESTOR ) 20 MG tablet Take 0.5 tablets (10 mg total) by mouth daily. (Patient taking differently: Take 20 mg by mouth daily. Taking 1/4 tablet, 5mg .)   telmisartan -hydrochlorothiazide  (MICARDIS  HCT) 80-12.5 MG tablet Take 1 tablet by mouth daily.   zolpidem  (AMBIEN ) 10 MG tablet Take 5 mg by mouth at bedtime.    benzonatate (TESSALON) 200 MG capsule TAKE 1 CAPSULE BY MOUTH THREE TIMES DAILY AS NEEDED FOR 7 DAYS (Patient not taking: Reported on 05/10/2021)  chlorhexidine (PERIDEX) 0.12 % solution ml    RINSE WITH ONE CAPFUL FOR 30 SECONDS AND SPIT USE TWICE DAILY BEGIN ONE WEEK PRIOR TO SURGERY   fosfomycin (MONUROL ) 3 g PACK Take 1 tablet every other day starting 04/29/19   gabapentin (NEURONTIN) 300 MG capsule Take 300 mg by mouth See admin instructions. 1 tablet by mouth 3 times daily for 2 weeks then 1 tablet by mouth twice daily for 2 weeks then 1 tablet by mouth once daily for 2 weeks.   levothyroxine  (SYNTHROID , LEVOTHROID) 100 MCG tablet Take 100 mcg by mouth daily before breakfast.   liothyronine  (CYTOMEL ) 5 MCG tablet Take 5 mcg by mouth daily.   meloxicam (MOBIC) 7.5 MG tablet meloxicam 7.5 mg  tablet   1 tablet every day by oral route.   methocarbamol (ROBAXIN) 500 MG tablet Take 1 tablet by mouth 3 (three) times daily as needed for muscle spasms.   nitrofurantoin, macrocrystal-monohydrate, (MACROBID) 100 MG capsule  (Patient not taking: Reported on 09/03/2023)   ondansetron  (ZOFRAN ) 4 MG tablet Take 1 tablet by mouth every 6 (six) hours as needed for nausea. (Patient not taking: Reported on 05/10/2021)   oxyCODONE  (OXY IR/ROXICODONE ) 5 MG immediate release tablet Take 1 tablet by mouth every 4 (four) hours as needed for moderate pain or severe pain.  (Patient not taking: Reported on 05/10/2021)   PRESCRIPTION MEDICATION Take 1 tablet by mouth daily. Pink pill, oval in shape with an L and a 200 (Patient not taking: Reported on 05/10/2021)   sulfamethoxazole-trimethoprim (BACTRIM DS) 800-160 MG tablet  (Patient not taking: Reported on 05/10/2021)   traMADol (ULTRAM) 50 MG tablet Take 1-2 tablets by mouth every 6 (six) hours as needed for moderate pain. (Patient not taking: Reported on 05/10/2021)   No current facility-administered medications for this visit. (Other)   REVIEW OF SYSTEMS: ROS   Positive for: Gastrointestinal, Musculoskeletal, Endocrine, Cardiovascular, Eyes Negative for: Constitutional, Neurological, Skin, Genitourinary, HENT, Respiratory, Psychiatric, Allergic/Imm, Heme/Lymph Last edited by Antonetta Almetta BRAVO, COT on 09/03/2023 12:46 PM.       ALLERGIES Allergies  Allergen Reactions   Atenolol Hydrochloride Cough   Vicodin [Hydrocodone-Acetaminophen ]     Hyper    PAST MEDICAL HISTORY Past Medical History:  Diagnosis Date   Cataract    Mixed form OU   DDD (degenerative disc disease), lumbar    Glaucoma    Hyperlipemia    Hypertension    Hypertensive retinopathy    OU   Hypothyroidism    Macular degeneration    Non-exu OU   Myocardial infarction [facility])    ? Takotsubo   Ocular hypertension    Sciatica of right side without back pain 12/30/2012   TMJ  (dislocation of temporomandibular joint)    Past Surgical History:  Procedure Laterality Date   ABDOMINAL HYSTERECTOMY     ABDOMINAL SACROCOLPOPEXY     APOGEE / PERIGEE REPAIR     BREAST BIOPSY     DILATION AND CURETTAGE OF UTERUS     HAND SURGERY Left 12/07/12   LEFT HEART CATHETERIZATION WITH CORONARY ANGIOGRAM N/A 01/18/2011   Procedure: LEFT HEART CATHETERIZATION WITH CORONARY ANGIOGRAM;  Surgeon: Ezra GORMAN Shuck, MD;  Location: [facility] CATH LAB;  Service: Cardiovascular;  Laterality: N/A;   TONSILLECTOMY     FAMILY HISTORY Family History  Problem Relation Age of Onset   Coronary artery disease Mother 61   Hyperlipidemia Mother    Hypertension Mother    Prostate cancer Father    Hypertension Father  Hyperlipidemia Father    SOCIAL HISTORY Social History   Tobacco Use   Smoking status: Never   Smokeless tobacco: Never  Vaping Use   Vaping status: Never Used  Substance Use Topics   Alcohol use: Yes    Alcohol/week: 1.0 standard drink of alcohol    Types: 1 Glasses of wine per week   Drug use: No       OPHTHALMIC EXAM:  Base Eye Exam     Visual Acuity (Snellen - Linear)       Right Left   Dist [place] 20/25 20/25   Dist ph Laie NI     Correction: Glasses         Tonometry (Tonopen, 12:53 PM)       Right Left   Pressure 12 10         Pupils       Pupils Dark Light Shape React APD   Right PERRL 4 3 Round Brisk None   Left PERRL 4 3 Round Brisk None         Visual Fields (Counting fingers)       Left Right    Full Full         Extraocular Movement       Right Left    Full, Ortho Full, Ortho         Neuro/Psych     Oriented x3: Yes   Mood/Affect: Normal         Dilation     Both eyes: 1.0% Mydriacyl, 2.5% Phenylephrine @ 12:54 PM           Slit Lamp and Fundus Exam     Slit Lamp Exam       Right Left   Lids/Lashes Dermatochalasis - upper lid, mild MGD Dermatochalasis - upper lid   Conjunctiva/Sclera White and quiet White  and quiet   Cornea Arcus, well healed temporal cataract wound, 1+ Punctate epithelial erosions centrally Mild arcus, well healed temporal cataract wound, 1+Punctate epithelial erosions   Anterior Chamber deep and clear deep and clear   Iris Round and moderately dilated Round and moderately dilated   Lens PCIOL in excellent position; 1+PCO PCIOL in excellent position   Anterior Vitreous mild syneresis, Posterior vitreous detachment, trace pigment Vitreous syneresis, mild condensations         Fundus Exam       Right Left   Disc Pink and Sharp, inferior rim thiniing Pink and Sharp, Compact   C/D Ratio 0.6 0.5   Macula Flat, blunted foveal reflex, scattered Drusen, PEDs, Retinal pigment epithelial mottling and early atrophy, No heme or edema Flat, blunted foveal reflex, Drusen, PEDs, Retinal pigment epithelial mottling, no heme or edema   Vessels attenuated, Tortuous attenuated, mild tortuosity   Periphery Attached, mild reticular degeneration, shallow IT Retinoschisis -- stable, no heme, no RT/RD, pigmented cystoid degeneration IT quad Attached, shallow schisis IT periphery, no heme, no RT/RD           Refraction     Wearing Rx       Sphere Cylinder Axis Add   Right +0.00 +0.75 004 +1.75   Left +0.00 +0.75 117 +1.75            IMAGING AND PROCEDURES  Imaging and Procedures for @TODAY @  OCT, Retina - OU - Both Eyes       Right Eye Quality was good. Central Foveal Thickness: 303. Progression has been stable. Findings include normal foveal contour, no IRF, no SRF,  retinal drusen , pigment epithelial detachment, outer retinal atrophy (Persistent PEDs and drusen, no fluid).   Left Eye Quality was good. Central Foveal Thickness: 306. Progression has been stable. Findings include normal foveal contour, no IRF, no SRF, retinal drusen , pigment epithelial detachment, outer retinal atrophy (Peristent PEDs -- no significant change from prior, patchy ORA).   Notes *Images  captured and stored on drive  Diagnosis / Impression:  Non-exu ARMD OU OD: Persistent PEDs and drusen, no fluid OS: Peristent PEDs--no significant change from prior, patchy ORA  Clinical management:  See below  Abbreviations: NFP - Normal foveal profile. CME - cystoid macular edema. PED - pigment epithelial detachment. IRF - intraretinal fluid. SRF - subretinal fluid. EZ - ellipsoid zone. ERM - epiretinal membrane. ORA - outer retinal atrophy. ORT - outer retinal tubulation. SRHM - subretinal hyper-reflective material             ASSESSMENT/PLAN:    ICD-10-CM   1. Intermediate stage nonexudative age-related macular degeneration of both eyes  H35.3132 OCT, Retina - OU - Both Eyes    2. Essential hypertension  I10     3. Hypertensive retinopathy of both eyes  H35.033     4. Ocular hypertension, bilateral  H40.053     5. Pseudophakia of both eyes  Z96.1     6. Bilateral retinoschisis  H33.103       1. Intermediate stage age related macular degeneration, non-exudative, both eyes  - BCVA 20/25 OU -- stable  - OCT shows OD: Interval improvement in prominent PEDs and drusen; OS: Peristent PEDs  --  no significant change from prior   - The incidence, anatomy, and pathology of dry AMD, risk of progression, and the AREDS and AREDS 2 study including smoking risks discussed with patient.   - Cont amsler grid monitoring   - f/u 9 months, sooner prn -- DFE/OCT  2,3. Hypertensive retinopathy OU  - discussed importance of tight BP control  - monitor    4. Ocular Hypertension OU  - IOP 12,10 today  - takes Timolol  QAM OU, Travatan QHS OU, Trusopt  BID OU  - under the management of Dr. Octavia  5. Pseudophakia OU  - s/p CE/IOL OU, Dr. Octavia  - IOLs in good position, doing well  - monitor   6. Retinoschisis OU  - shallow schisis IT quad OU   Ophthalmic Meds Ordered this visit:  No orders of the defined types were placed in this encounter.    Return in about 9 months (around  06/02/2024) for f/u non-exu ARMD OU, DFE, OCT.  There are no Patient Instructions on file for this visit.  This document serves as a record of services personally performed by Redell JUDITHANN Hans, MD, PhD. It was created on their behalf by Almetta Pesa, an ophthalmic technician. The creation of this record is the provider's dictation and/or activities during the visit.    Electronically signed by: Almetta Pesa, OA, 09/07/23  12:12 PM  This document serves as a record of services personally performed by Redell JUDITHANN Hans, MD, PhD. It was created on their behalf by Alan PARAS. Delores, OA an ophthalmic technician. The creation of this record is the provider's dictation and/or activities during the visit.    Electronically signed by: Alan PARAS. Delores, OA 09/07/23 12:12 PM  Redell JUDITHANN Hans, M.D., Ph.D. Diseases & Surgery of the Retina and Vitreous [facility]  I have reviewed the above documentation for accuracy and completeness, and  I agree with the above. Redell JUDITHANN Hans, M.D., Ph.D. 09/07/23 12:12 PM   Abbreviations: M myopia (nearsighted); A astigmatism; H hyperopia (farsighted); P presbyopia; Mrx spectacle prescription;  CTL contact lenses; OD right eye; OS left eye; OU both eyes  XT exotropia; ET esotropia; PEK punctate epithelial keratitis; PEE punctate epithelial erosions; DES dry eye syndrome; MGD meibomian gland dysfunction; ATs artificial tears; PFAT's preservative free artificial tears; NSC nuclear sclerotic cataract; PSC posterior subcapsular cataract; ERM epi-retinal membrane; PVD posterior vitreous detachment; RD retinal detachment; DM diabetes mellitus; DR diabetic retinopathy; NPDR non-proliferative diabetic retinopathy; PDR proliferative diabetic retinopathy; CSME clinically significant macular edema; DME diabetic macular edema; dbh dot blot hemorrhages; CWS cotton wool spot; POAG primary open angle glaucoma; C/D cup-to-disc ratio; HVF humphrey visual field; GVF  goldmann visual field; OCT optical coherence tomography; IOP intraocular pressure; BRVO Branch retinal vein occlusion; CRVO central retinal vein occlusion; CRAO central retinal artery occlusion; BRAO branch retinal artery occlusion; RT retinal tear; SB scleral buckle; PPV pars plana vitrectomy; VH Vitreous hemorrhage; PRP panretinal laser photocoagulation; IVK intravitreal kenalog; VMT vitreomacular traction; MH Macular hole;  NVD neovascularization of the disc; NVE neovascularization elsewhere; AREDS age related eye disease study; ARMD age related macular degeneration; POAG primary open angle glaucoma; EBMD epithelial/anterior basement membrane dystrophy; ACIOL anterior chamber intraocular lens; IOL intraocular lens; PCIOL posterior chamber intraocular lens; Phaco/IOL phacoemulsification with intraocular lens placement; PRK photorefractive keratectomy; LASIK laser assisted in situ keratomileusis; HTN hypertension; DM diabetes mellitus; COPD chronic obstructive pulmonary disease

## 2023-09-03 ENCOUNTER — Encounter (INDEPENDENT_AMBULATORY_CARE_PROVIDER_SITE_OTHER): Payer: Self-pay | Admitting: Ophthalmology

## 2023-09-03 ENCOUNTER — Ambulatory Visit (INDEPENDENT_AMBULATORY_CARE_PROVIDER_SITE_OTHER): Admitting: Ophthalmology

## 2023-09-03 DIAGNOSIS — Z961 Presence of intraocular lens: Secondary | ICD-10-CM

## 2023-09-03 DIAGNOSIS — H35033 Hypertensive retinopathy, bilateral: Secondary | ICD-10-CM

## 2023-09-03 DIAGNOSIS — H353132 Nonexudative age-related macular degeneration, bilateral, intermediate dry stage: Secondary | ICD-10-CM | POA: Diagnosis not present

## 2023-09-03 DIAGNOSIS — I10 Essential (primary) hypertension: Secondary | ICD-10-CM | POA: Diagnosis not present

## 2023-09-03 DIAGNOSIS — H33103 Unspecified retinoschisis, bilateral: Secondary | ICD-10-CM | POA: Diagnosis not present

## 2023-09-03 DIAGNOSIS — H40053 Ocular hypertension, bilateral: Secondary | ICD-10-CM

## 2023-09-07 ENCOUNTER — Encounter (INDEPENDENT_AMBULATORY_CARE_PROVIDER_SITE_OTHER): Payer: Self-pay | Admitting: Ophthalmology

## 2024-04-06 NOTE — Progress Notes (Signed)
 "    Michelle Greenland, MD Reason for referral-palpitations  HPI: 79 year old female for evaluation of palpitations at request of [name], MD. Seen previously but not since 2014. Previously admitted to [facility] with a non-ST elevation myocardial infarction. Cardiac catheterization performed in November of 2012 showed a 25% ostial first diagonal. There was no other coronary disease noted. Ejection fraction was 40%. There was note that mid wall Takotsubo was suspected. Followup echocardiogram in November of 2012 showed normal LV function with mild left ventricular hypertrophy. CTA showed no dissection.  Patient states that in the summer she had an episode of her heart racing suddenly.  Lasted for 45 minutes and resolved spontaneously.  No associated symptoms.  Had a second episode in November similar in description.  Otherwise denies chest pain or syncope.  She has dyspnea with more vigorous activities but not routine activities.  Cardiology now asked to evaluate.  Current Outpatient Medications  Medication Sig Dispense Refill   acetaminophen  (TYLENOL ) 500 MG tablet Take 500 mg by mouth every 6 (six) hours as needed for mild pain, moderate pain or fever.     Calcium  Carb-Cholecalciferol  (OYSTER SHELL CALCIUM ) 500-400 MG-UNIT TABS Calcium  500 + D 500 mg (1,250 mg)-400 unit tablet     calcium  citrate-vitamin D  (CITRACAL+D) 315-200 MG-UNIT per tablet Take 1-2 tablets by mouth daily.      diclofenac Sodium (VOLTAREN) 1 % GEL diclofenac 1 % topical gel     dicyclomine (BENTYL) 10 MG capsule      dicyclomine (BENTYL) 10 MG capsule Take 10 mg by mouth 4 (four) times daily as needed.     dorzolamide  (TRUSOPT ) 2 % ophthalmic solution Place 1 drop into both eyes 2 (two) times daily.     estradiol (ESTRACE) 0.1 MG/GM vaginal cream estradiol 0.01% (0.1 mg/gram) vaginal cream   1 applicatorful twice a week by vaginal route.     estradiol (VIVELLE-DOT) 0.1 MG/24HR Place 1 patch onto the skin  2 (two) times a week. Change on Wednesday and Saturday     famotidine (PEPCID) 40 MG tablet Bottle     ibuprofen (ADVIL) 200 MG tablet Take 200 mg by mouth every 6 (six) hours as needed for fever or moderate pain.     levothyroxine  (SYNTHROID , LEVOTHROID) 75 MCG tablet Take 1 tablet (75 mcg total) by mouth daily. 30 tablet 11   potassium chloride  (K-DUR,KLOR-CON ) 10 MEQ tablet Take 2 tablets (20 mEq total) by mouth 2 (two) times daily. 120 tablet 11   rosuvastatin  (CRESTOR ) 20 MG tablet Take 0.5 tablets (10 mg total) by mouth daily. (Patient taking differently: Take 20 mg by mouth daily. Taking 1/4 tablet, 5mg .) 30 tablet 5   telmisartan -hydrochlorothiazide  (MICARDIS  HCT) 80-12.5 MG tablet Take 1 tablet by mouth daily.     timolol  (TIMOPTIC ) 0.5 % ophthalmic solution Place 1 drop into both eyes daily.      TRAVATAN Z 0.004 % SOLN ophthalmic solution Place 1 drop into both eyes at bedtime.      zolpidem  (AMBIEN ) 10 MG tablet Take 5 mg by mouth at bedtime.      No current facility-administered medications for this visit.    Allergies[1]   Past Medical History:  Diagnosis Date   Cataract    Mixed form OU   DDD (degenerative disc disease), lumbar    Glaucoma    Hyperlipemia    Hypertension    Hypertensive retinopathy    OU   Hypothyroidism    Macular degeneration  Non-exu OU   Myocardial infarction [facility])    ? Takotsubo   Ocular hypertension    Sciatica of right side without back pain 12/30/2012   TMJ (dislocation of temporomandibular joint)     Past Surgical History:  Procedure Laterality Date   ABDOMINAL HYSTERECTOMY     ABDOMINAL SACROCOLPOPEXY     APOGEE / PERIGEE REPAIR     BREAST BIOPSY     DILATION AND CURETTAGE OF UTERUS     HAND SURGERY Left 12/07/12   LEFT HEART CATHETERIZATION WITH CORONARY ANGIOGRAM N/A 01/18/2011   Procedure: LEFT HEART CATHETERIZATION WITH CORONARY ANGIOGRAM;  Surgeon: Ezra GORMAN Shuck, MD;  Location: [facility] CATH LAB;  Service: Cardiovascular;   Laterality: N/A;   TONSILLECTOMY      Social History   Socioeconomic History   Marital status: Married    Spouse name: Not on file   Number of children: 2   Years of education: college   Highest education level: Not on file  Occupational History   Occupation: retired  Tobacco Use   Smoking status: Never   Smokeless tobacco: Never  Vaping Use   Vaping status: Never Used  Substance and Sexual Activity   Alcohol use: Yes    Alcohol/week: 1.0 standard drink of alcohol    Types: 1 Glasses of wine per week    Comment: Occasional   Drug use: No   Sexual activity: Not Currently    Partners: Male  Other Topics Concern   Not on file  Social History Narrative   Marital Status: Married [place])   Children:  G2 P2002   Pets:  None    Living Situation: Lives with husband    Occupation: Retired   Education: 16   Tobacco Use/Exposure:  None    Alcohol Use:  Occasional   Drug Use:  None   Diet:  Regular   Exercise:  Walking    Hobbies: Traveling, Reading   Social Drivers of Health   Tobacco Use: Low Risk (04/15/2024)   Patient History    Smoking Tobacco Use: Never    Smokeless Tobacco Use: Never    Passive Exposure: Not on file  Financial Resource Strain: Not on file  Food Insecurity: Low Risk (04/01/2024)   Received from [facility]   Epic    Within the past 12 months, you worried that your food would run out before you got money to buy more: Never true    Within the past 12 months, the food you bought just didn't last and you didn't have money to get more. : Never true  Transportation Needs: No Transportation Needs (04/01/2024)   Received from [facility]    In the past 12 months, has lack of reliable transportation kept you from medical appointments, meetings, work or from getting things needed for daily living? : No  Physical Activity: Not on file  Stress: Not on file  Social Connections: Socially Integrated (04/01/2024)   Received from [facility]    Social Connection and Isolation Panel    In a typical week, how many times do you talk on the phone with family, friends, or neighbors?: More than three times a week    How often do you get together with friends or relatives?: More than three times a week    How often do you attend church or religious services?: More than 4 times per year    Do you belong to any clubs or organizations such as church groups, unions,  fraternal or athletic groups, or school groups?: Yes    How often do you attend meetings of the clubs or organizations you belong to?: More than 4 times per year    Are you married, widowed, divorced, separated, never married, or living with a partner?: Married  Intimate Partner Violence: Unknown (06/13/2021)   Received from [facility]   HITS    Physically Hurt: Not on file    Insult or Talk Down To: Not on file    Threaten Physical Harm: Not on file    Scream or Curse: Not on file  Depression (EYV7-0): Not on file  Alcohol Screen: Not on file  Housing: Low Risk (04/01/2024)   Received from [facility]   Epic    What is your living situation today?: Not on file    Think about the place you live. Do you have problems with any of the following? Choose all that apply:: None/None on this list  Utilities: Low Risk (04/01/2024)   Received from [facility]   Utilities    In the past 12 months has the electric, gas, oil, or water company threatened to shut off services in your home? : No  Health Literacy: Not on file    Family History  Problem Relation Age of Onset   Coronary artery disease Mother 36   Hyperlipidemia Mother    Hypertension Mother    Prostate cancer Father    Hypertension Father    Hyperlipidemia Father     ROS: no fevers or chills, productive cough, hemoptysis, dysphasia, odynophagia, melena, hematochezia, dysuria, hematuria, rash, seizure activity, orthopnea, PND, pedal edema, claudication. Remaining systems are negative.  Physical Exam:   Blood  pressure (!) 140/80, pulse 61, height 5' 6 (1.676 m), weight 164 lb 3.2 oz (74.5 kg), SpO2 96%.  General:  Well developed/well nourished in NAD Skin warm/dry Patient not depressed No peripheral clubbing Back-normal HEENT-normal/normal eyelids Neck supple/normal carotid upstroke bilaterally; no bruits; no JVD; no thyromegaly chest - CTA/ normal expansion CV - RRR/normal S1 and S2; no murmurs, rubs or gallops;  PMI nondisplaced Abdomen -NT/ND, no HSM, no mass, + bowel sounds, no bruit 2+ femoral pulses, no bruits Ext-no edema, chords, 2+ DP Neuro-grossly nonfocal  EKG Interpretation Date/Time:  Thursday April 15 2024 11:53:07 EST Ventricular Rate:  58 PR Interval:  214 QRS Duration:  80 QT Interval:  436 QTC Calculation: 428 R Axis:   2  Text Interpretation: Sinus bradycardia with 1st degree A-V block with Premature atrial complexes Confirmed by Pietro Rogue ([id number]) on 04/15/2024 11:54:27 AM    A/P  1 palpitations-etiology unclear though description sounds concerning for SVT.  Will schedule echocardiogram to reassess LV function.  We discussed recording rhythm strips with her smart watch to identify any significant arrhythmias and she will likely purchase an Apple watch.  2 hyperlipidemia-continue statin.  3 history of Takotsubo cardiomyopathy-LV function is normalized on most recent echocardiogram.  4 hypertension-continue present blood pressure medications.  Rogue Pietro, MD     [1]  Allergies Allergen Reactions   Ace Inhibitors Cough and Other (See Comments)    Other reaction(s): Cough (ALLERGY/intolerance)  cough   Atenolol Hydrochloride Cough   Vicodin [Hydrocodone-Acetaminophen ]     Hyper    "

## 2024-04-15 ENCOUNTER — Encounter: Payer: Self-pay | Admitting: Cardiology

## 2024-04-15 ENCOUNTER — Ambulatory Visit: Admitting: Cardiology

## 2024-04-15 VITALS — BP 140/80 | HR 61 | Ht 66.0 in | Wt 164.2 lb

## 2024-04-15 DIAGNOSIS — R002 Palpitations: Secondary | ICD-10-CM

## 2024-04-15 DIAGNOSIS — I10 Essential (primary) hypertension: Secondary | ICD-10-CM | POA: Diagnosis not present

## 2024-04-15 NOTE — Patient Instructions (Signed)
" ° °  Testing/Procedures:  Your physician has requested that you have an echocardiogram. Echocardiography is a painless test that uses sound waves to create images of your heart. It provides your doctor with information about the size and shape of your heart and how well your hearts chambers and valves are working. This procedure takes approximately one hour. There are no restrictions for this procedure. Please do NOT wear cologne, perfume, aftershave, or lotions (deodorant is allowed). Please arrive 15 minutes prior to your appointment time.  Please note: We ask at that you not bring children with you during ultrasound (echo/ vascular) testing. Due to room size and safety concerns, children are not allowed in the ultrasound rooms during exams. Our front office staff cannot provide observation of children in our lobby area while testing is being conducted. An adult accompanying a patient to their appointment will only be allowed in the ultrasound room at the discretion of the ultrasound technician under special circumstances. We apologize for any inconvenience. [facility] FLOOR IMAGING DEPARTMENT  Follow-Up: At [facility], you and your health needs are our priority.  As part of our continuing mission to provide you with exceptional heart care, our providers are all part of one team.  This team includes your primary Cardiologist (physician) and Advanced Practice Providers or APPs (Physician Assistants and Nurse Practitioners) who all work together to provide you with the care you need, when you need it.  Your next appointment:   6 month(s)  Provider:   REDELL SHALLOW MD            "

## 2024-05-13 ENCOUNTER — Ambulatory Visit (HOSPITAL_BASED_OUTPATIENT_CLINIC_OR_DEPARTMENT_OTHER)

## 2024-05-27 ENCOUNTER — Ambulatory Visit (HOSPITAL_COMMUNITY)

## 2024-06-02 ENCOUNTER — Encounter (INDEPENDENT_AMBULATORY_CARE_PROVIDER_SITE_OTHER): Admitting: Ophthalmology
